# Patient Record
Sex: Male | Born: 1985 | Race: White | Hispanic: No | Marital: Single | Smoking: Never smoker
Health system: Southern US, Community
[De-identification: ages and names within clinical notes are randomized; demographics above are authoritative.]

## PROBLEM LIST (undated history)

## (undated) DIAGNOSIS — L989 Disorder of the skin and subcutaneous tissue, unspecified: Secondary | ICD-10-CM

## (undated) DIAGNOSIS — Q249 Congenital malformation of heart, unspecified: Secondary | ICD-10-CM

## (undated) DIAGNOSIS — E119 Type 2 diabetes mellitus without complications: Secondary | ICD-10-CM

## (undated) HISTORY — PX: KNEE SURGERY: SHX244

---
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
## 2007-11-25 ENCOUNTER — Emergency Department (HOSPITAL_COMMUNITY): Admission: EM | Admit: 2007-11-25 | Discharge: 2007-11-26 | Payer: Self-pay | Admitting: Emergency Medicine

## 2008-09-29 ENCOUNTER — Ambulatory Visit (HOSPITAL_COMMUNITY): Admission: RE | Admit: 2008-09-29 | Discharge: 2008-09-29 | Payer: Self-pay | Admitting: Family Medicine

## 2010-11-23 LAB — BLOOD GAS, VENOUS
Acid-Base Excess: 1 mmol/L (ref 0.0–2.0)
Bicarbonate: 25.7 mEq/L — ABNORMAL HIGH (ref 20.0–24.0)
FIO2: 0.21 %
O2 Saturation: 33.4 %
Patient temperature: 98.6
TCO2: 27.1 mmol/L (ref 0–100)
pCO2, Ven: 45.6 mmHg (ref 45.0–50.0)
pH, Ven: 7.369 — ABNORMAL HIGH (ref 7.250–7.300)
pO2, Ven: 20.5 mmHg — CL (ref 30.0–45.0)

## 2010-12-24 NOTE — Discharge Summary (Signed)
NAME:  [name], [name]                          ACCOUNT NO.:  [id number]   MEDICAL RECORD NO.:  [id number]                   PATIENT TYPE:  INP   LOCATION:  1610                                 FACILITY:  [facility]   PHYSICIAN:  Luna Glasgow, M.D.            DATE OF BIRTH:  [birth year]   DATE OF ADMISSION:  04/01/2002  DATE OF DISCHARGE:  04/05/2002                                 DISCHARGE SUMMARY   ADMITTING DIAGNOSIS:  Diabetic ketoacidosis.   DISCHARGE DIAGNOSES:  1. Diabetic ketoacidosis, resolved.  2. New-onset type 1 diabetes.   ADMISSION HISTORY:  The patient is a 25 year old white male with Carylon Perches-  Danlos, type I, and ADHD, who presented to his primary care physician at  [facility] on April 01, 2002 with weakness and  vomiting overnight, in addition to a two-week history of polydipsia,  polyuria and a 27-pound weight loss (the previous month).  At the  physician's office, he was found to be hypertensive and hyperglycemic with a  blood sugar of 500.  After IV fluids were started in the physician's office,  he was transported by EMS to [facility] where he was placed on  insulin drip at 0.1 u/kg per hour and two times his maintenance IV fluid.  On admission, both the patient and his parents denied that he had any  abdominal pain, hyperphagia, fatigue, lethargy, mental status changes, fever  or ingestions and there is no family history of any diabetes.   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   MEDICATIONS:  The patient is on no medications although he had been  prescribed Ritalin.   ALLERGIES:  He had an allergy to SULFA.   PHYSICAL EXAMINATION:  On presentation in the emergency room, he was  afebrile with heart rate in the 100s, saturating  99% on room air.  He was  alert, tired appearing, but had no fetid odor to his breath and had no  Kussmaul's breathing.   LABORATORY AND ACCESSORY CLINICAL DATA:  His blood sugar on presentation in  the emergency room was documented at 946.  His arterial blood gas showed a  pH of 7.248, PCO2 of 32.8, bicarb of 14 and a base excess of 12.  Chemistries showed a sodium of 141, potassium of 6.6, a BUN of 30 and a  creatinine of 2.   HOSPITAL COURSE:  The patient's hydration with two times maintenance load  was continued and he was maintained on his insulin drip until his pH was  corrected.  His blood sugars were monitored with a goal of decreasing them  by approximately 100 mg/dl per hour.  Diabetes education was begun for both  the patient and his parents.  The patient's insulin  drip was stopped once  his blood sugars were brought down to 200 to 300 range and he was begun on a  regimen of subcutaneous insulin as well as sliding-scale coverage.  During  his hospital stay, the patient was seen by both pediatric psychologist and  nutritionist, as well as diabetes educator.  During his stay, the goal of  the patient's blood sugars were 150 to 250 mg/dl.  His regimen was increased  from 0.75 u/kg per day to 1 u/kg per day to achieve better control of his  blood sugars.  Ketonuria had resolved by hospital day #2 and IV fluids were  discontinued.  During his hospital stay, ___________ blood through use of  the lancets.  The patient and his parents were informed of exposure with the  need for consistent sugar testing, to which they consented.  Forms were  filled out for the patient's school, informing them of his condition and  medications that he would need to take during school hours as well as  treatment for hypo or hyperglycemia.  Throughout the hospital stay, the  patient and his family received repetitive education regarding carbohydrate  counting, diabetic diet and diabetes management.  His  CBGs remained elevated  above target level, especially in the evening, so his evening NPH and  Regular dosages were increased so that his final regimen upon discharge was  20 units of NPH and 10 units of Regular q.a.m. and 9 units of NPH and 9  units of Regular q.p.m.  It was anticipated that his daytime CBGs would be  decreased somewhat due to increased activity during the day once he was  outside the hospital.  Followup was scheduled with Dr. Danise Mina, a  [facility] endocrinologist, as well as a geneticist at [facility] for followup on  Ehlers-Danlos.  On hospital day #5, after adequate education and control of  his blood sugars were obtained, the patient was discharged to home with his  parents.   DISCHARGE CONDITION:  Improved and stable.   DISPOSITION:  Discharged to home with parents.   DISCHARGE MEDICATIONS:  Metformin regimen of 20 units NPH and 10 units of  Regular insulin q.a.m. as well as 9 units of NPH and 9 units of Regular  insulin q.p.m.   DISCHARGE INSTRUCTIONS:  The patient was instructed to check his blood sugar  prior to eating breakfast in the morning and to inject his morning regimen  if blood sugar was greater than 60, to be followed by a normal breakfast.  At lunchtime, the patient was instructed to take his blood sugar prior to  eating or drinking.  At dinnertime, the patient was also instructed to check  his blood sugar prior to eating or drinking and if his blood sugar was  greater than 60, to inject 9 units of NPH and 9 units Regular insulin prior  to eating a normal dinner.  Finally, the patient was instructed to his blood  sugar prior to bedtime.  He was instructed to log all these blood sugar  readings into a log book which he was instructed to bring to all future  doctor's appointments.  The patient was also instructed to check his blood  sugar anytime he has symptoms of hypoglycemia, which have been instructed on.  If his blood sugar was less than 60,  he was instructed to drink orange  juice, eat some candy or eat a peanut butter and jelly sandwich and then  call Dr. Danise Mina  at [phone].  If he was unable to reach her  immediately, he was then instructed to call the pediatrics floor at [phone]  and ask for a pediatric resident.  If his blood sugar was found to be over  350, he was also instructed to call Dr. Danise Mina, or the pediatric  resident on the floor if he was unable to reach her.  For blood sugar over  300, he was also instructed to check for ketones as he had been instructed  to do in the hospital.  He was to call Dr. Langston Masker or the pediatric resident  at [facility] if his ketones were measured at moderate or greater.  The  patient was also instructed to call Dr. Danise Mina or the pediatric  resident immediately if he had any nausea or vomiting.   DIET:  The patient was to follow a diabetic diet of 220 kilocalorie as  taught in the hospital and to try to count his carbohydrate intake.   FOLLOWUP:  The patient was to call Dr. Danise Mina at [phone] on  August 30th to let her know how he was doing status post discharge.  He was  also to follow up with Dr. Danise Mina on Tuesday, September 2nd, at 11  a.m. at her office in [place].  On Wednesday, September 10th, at 8 a.m.,  the patient and his parents were instructed to [facility].  Finally, an appointment was made with  Shearon Balo, a geneticist at [facility], for his Lorinda Creed syndrome on October 10, 2002 at 8:30 a.m.  The patient's parents were  instructed to have his primary physician, Dr. Windle Guard, send the  medical information , a fax, to [phone], attention Albin Felling, prior to his  visit with the geneticist.  The patient's parents were also given a contact  number for [facility] at [phone] for a local Ehlers-Danlos support  group.     Georgina Peer, M.D.                 Luna Glasgow, M.D.    JM/MEDQ  D:  04/28/2002  T:  05/01/2002  Job:  [phone]

## 2011-05-03 LAB — URINALYSIS, ROUTINE W REFLEX MICROSCOPIC
Glucose, UA: >1000 — AB
Hgb urine dipstick: NEGATIVE
Leukocytes, UA: NEGATIVE
Nitrite: NEGATIVE
Protein, ur: NEGATIVE
Specific Gravity, Urine: 1.044 — ABNORMAL HIGH
Urobilinogen, UA: 1
pH: 6

## 2011-05-03 LAB — HEPATIC FUNCTION PANEL
ALT: 18
AST: 26
Albumin: 4.3
Alkaline Phosphatase: 71
Bilirubin, Direct: 0.2
Indirect Bilirubin: 1.1 — ABNORMAL HIGH
Total Bilirubin: 1.3 — ABNORMAL HIGH
Total Protein: 7.2

## 2011-05-03 LAB — BASIC METABOLIC PANEL
BUN: 20
CO2: 25
Calcium: 9.8
Chloride: 105
Creatinine, Ser: 1.02
GFR calc Af Amer: >60
GFR calc non Af Amer: >60
Glucose, Bld: 185 — ABNORMAL HIGH
Potassium: 4
Sodium: 140

## 2011-05-03 LAB — CBC
HCT: 46.4
Hemoglobin: 16.3
MCHC: 35.2
MCV: 94
Platelets: 179
RBC: 4.94
RDW: 11.9
WBC: 10.3

## 2011-05-03 LAB — DIFFERENTIAL
Basophils Absolute: 0
Basophils Relative: 0
Eosinophils Absolute: 0
Eosinophils Relative: 0
Lymphocytes Relative: 3 — ABNORMAL LOW
Lymphs Abs: 0.3 — ABNORMAL LOW
Monocytes Absolute: 0.3
Monocytes Relative: 3
Neutro Abs: 9.7 — ABNORMAL HIGH
Neutrophils Relative %: 94 — ABNORMAL HIGH

## 2011-05-03 LAB — LIPASE, BLOOD: Lipase: 17

## 2011-05-03 LAB — URINE MICROSCOPIC-ADD ON: Urine-Other: NONE SEEN

## 2013-02-18 ENCOUNTER — Emergency Department (HOSPITAL_COMMUNITY)
Admission: EM | Admit: 2013-02-18 | Discharge: 2013-02-18 | Disposition: A | Discharge status: Home / Self Care | Payer: Self-pay | Attending: Emergency Medicine | Admitting: Emergency Medicine

## 2013-02-18 ENCOUNTER — Encounter (HOSPITAL_COMMUNITY): Payer: Self-pay

## 2013-02-18 DIAGNOSIS — E119 Type 2 diabetes mellitus without complications: Secondary | ICD-10-CM | POA: Insufficient documentation

## 2013-02-18 DIAGNOSIS — D2330 Other benign neoplasm of skin of unspecified part of face: Secondary | ICD-10-CM

## 2013-02-18 DIAGNOSIS — Z872 Personal history of diseases of the skin and subcutaneous tissue: Secondary | ICD-10-CM | POA: Insufficient documentation

## 2013-02-18 DIAGNOSIS — Z8679 Personal history of other diseases of the circulatory system: Secondary | ICD-10-CM | POA: Insufficient documentation

## 2013-02-18 DIAGNOSIS — D367 Benign neoplasm of other specified sites: Secondary | ICD-10-CM | POA: Insufficient documentation

## 2013-02-18 DIAGNOSIS — Z794 Long term (current) use of insulin: Secondary | ICD-10-CM | POA: Insufficient documentation

## 2013-02-18 HISTORY — DX: Congenital malformation of heart, unspecified: Q24.9

## 2013-02-18 HISTORY — DX: Type 2 diabetes mellitus without complications: E11.9

## 2013-02-18 HISTORY — DX: Disorder of the skin and subcutaneous tissue, unspecified: L98.9

## 2013-02-18 MED ORDER — CLINDAMYCIN HCL 150 MG PO CAPS: 450.0000 mg | ORAL_CAPSULE | Freq: Three times a day (TID) | ORAL | Status: DC

## 2013-02-18 NOTE — ED Notes (Signed)
Patient states that he chipped his right lower tooth 2 months ago and has had dental pain x 1 week. Patient has swelling to the right lower jaw area and pain.

## 2013-02-18 NOTE — ED Notes (Signed)
rx x 1 given for clindamycin

## 2013-02-18 NOTE — ED Provider Notes (Signed)
History  This chart was scribed for Louis Mutton, PA-C working with [name], MD by [name], ED scribe. This patient was seen in room WTR8/WTR8 and the patient's care was started at 7:00 PM.  CSN: [id number] Arrival date & time 02/18/13  1729   Chief Complaint  Patient presents with  . dental abscess    The history is provided by the patient. No language interpreter was used.    HPI Comments: [name] is a 27 y.o. male who presents to the Emergency Department complaining of gradually worsening, constant right lower facial swelling that started 1 week ago. Pt states he chipped his right lower tooth 2 months ago but it has no associated pain. He states when he opens his mouth, he gets a throbbing pain in his jaw. Pt states the pain is tolerable when he chews. Pt states he took Ketoralac with no relief. Denied radiation. Pt denies neck pain, neck stiffness, fevers, chills, diaphoresis, CP, SOB, difficulty breathing, HA, numbness, tingling, visual disturbance, hiking, bug/tic bites as associated symptoms. Pt states he rides a motorcycle so he sweats in his helmet often.   PCP is [name] in [place]  Past Medical History  Diagnosis Date  . Diabetes mellitus without complication   . Skin disorder   . Congenital heart problem    Past Surgical History  Procedure Laterality Date  . Knee surgery     History reviewed. No pertinent family history. History  Substance Use Topics  . Smoking status: Never Smoker   . Smokeless tobacco: Never Used  . Alcohol Use: No    Review of Systems  Constitutional: Negative for fever and chills.  HENT: Positive for facial swelling and dental problem. Negative for neck pain and neck stiffness.   Eyes: Negative for visual disturbance.  Respiratory: Negative for shortness of breath.   Cardiovascular: Negative for chest pain.  Neurological: Negative for numbness and headaches.  All other systems reviewed and are  negative.    Allergies  Sulfa antibiotics  Home Medications   Current Outpatient Rx  Name  Route  Sig  Dispense  Refill  . insulin aspart (NOVOLOG) 100 UNIT/ML injection   Subcutaneous   Inject 14 Units into the skin 3 (three) times daily with meals.         . insulin detemir (LEVEMIR) 100 UNIT/ML injection   Subcutaneous   Inject 32 Units into the skin 2 (two) times daily.         Marland Kitchen ketorolac (TORADOL) 10 MG tablet   Oral   Take 10 mg by mouth every 6 (six) hours as needed for pain.         . clindamycin (CLEOCIN) 150 MG capsule   Oral   Take 3 capsules (450 mg total) by mouth 3 (three) times daily.   63 capsule   0     BP 119/74  Pulse 92  Temp(Src) 98.2 F (36.8 C) (Oral)  Resp 18  Ht 5\' 9"  (1.753 m)  Wt 160 lb (72.576 kg)  BMI 23.62 kg/m2  SpO2 98%  Physical Exam  Nursing note and vitals reviewed. Constitutional: He is oriented to person, place, and time. He appears well-developed and well-nourished. No distress.  HENT:  Head: Normocephalic and atraumatic.  Mouth/Throat: No oropharyngeal exudate, posterior oropharyngeal erythema or tonsillar abscesses.    Cracked lower right 2nd molar. Negative submandibular lesions.  Negative abscess, cyst, pain upon palpation to the gums - upper and lower and bilaterally. Negative active  bleeding or drainage. Negative trismus. Negative sublingual lesion.   Eyes: Conjunctivae and EOM are normal. Pupils are equal, round, and reactive to light. Right eye exhibits no discharge. Left eye exhibits no discharge.  Neck: Normal range of motion. Neck supple. No tracheal deviation present.    Full ROM  Cardiovascular: Normal rate, regular rhythm and normal heart sounds.   No murmur heard. Pulses:      Radial pulses are 2+ on the right side, and 2+ on the left side.  Pulmonary/Chest: Effort normal and breath sounds normal. No respiratory distress. He has no wheezes. He has no rales.  Musculoskeletal: Normal range of  motion.  Lymphadenopathy:    He has no cervical adenopathy.  Neurological: He is alert and oriented to person, place, and time. No cranial nerve deficit. GCS eye subscore is 4. GCS verbal subscore is 5. GCS motor subscore is 6.  Cranial nerves III-XII grossly intact.   Skin: Skin is warm and dry. No rash noted. No erythema.  Psychiatric: He has a normal mood and affect. His behavior is normal.    ED Course  [place] bedside Date/Time: 02/18/2013 8:39 PM Performed by: [name] Authorized by: [name] Consent: Verbal consent obtained. Risks and benefits: risks, benefits and alternatives were discussed Consent given by: patient Patient understanding: patient states understanding of the procedure being performed Patient consent: the patient's understanding of the procedure matches consent given Patient identity confirmed: verbally with patient and arm band Local anesthesia used: no Patient sedated: no Patient tolerance: Patient tolerated the procedure well with no immediate complications. Comments: Heterogenous of tissue and fluid - negative for drainage. Firm cyst-like lesion on right mandibular region    (including critical care time)    DIAGNOSTIC STUDIES: Oxygen Saturation is 98% on RA, normal by my interpretation.    COORDINATION OF CARE: 8:03 PM-Discussed treatment plan which includes talking with Dr. [name] with pt at bedside and pt agreed to plan.   Labs Reviewed - No data to display No results found. 1. Dermoid cyst of face   2. DM (diabetes mellitus)     MDM  I personally performed the services described in this documentation, which was scribed in my presence. The recorded information has been reviewed and is accurate.  [name] is a 27 y/o M with PMHx of DM presenting to the ED with swelling to the right mandibular region that has been ongoing for the past week, gotten larger over the past couple of days. Denied pain with rest, throbbing sensation  with opening mouth. Denied fever, chills, neck stiffness, negative pain.  Approximately 1.5 cm x 1.5 cm circular, firm cyst-like lesion located to the right mandibular region. Negative pain upon palpation - negative inflammation, erythema, drainage noted. Negative nuchal rigidity, negative neck stiffness, negative lymphadenopathy. Negative focal neurological deficits noted. Negative sublingual lesion, negative trismus. Cracked tooth to the right lower second molar - negative pain upon palpation with tongue depressor. Negative abscesses or cyst palpated during mouth exam.  Discussed case with Dr. Abran Duke - Dr. Abran Duke assessed patient - [place] bedside performed, heterogenous tissue and fluid noted to the lesion - doubt abscess. Doubt parotiditis. Doubt Ludwig's angina. Doubt submandibular gland blockage. Suspicion to be of cyst formation, possible dental abscess (?). Dr. Abran Duke recommended placing patient on antibiotic therapy and outpatient CT with PCP. Patient stable, afebrile. Discharged patient. Placed patient on antibiotics - high risk of infection due to being diabetic. Recommended patient to use Ibuprofen as when needed.  Referred patient to PCP for follow-up, discussed to get CT as outpatient. Referred to dentist and oral surgeon. Discussed with patient to continue to monitor symptoms and if symptoms are to worsen or change to report back to the ED -strict return instructions given.  Patient agreed to plan of care, understood, all questions answered.   Louis Mutton, PA-C 02/19/13 [id number]

## 2013-02-20 NOTE — ED Provider Notes (Signed)
Medical screening examination/treatment/procedure(s) were conducted as a shared visit with non-physician practitioner(s) or resident  and myself.  I personally evaluated the patient during the encounter and agree with the findings and plan unless otherwise indicated.  Firm swelling at angle of mandible.  No induration or warmth, no signs of active infection except swelling.  Poor dentition.  No submandibular swelling, supple neck.  Well appearing.  Recommended CT in ED, pt prefers outpt CT and fup.  Abx due to possible infectious source and pt DM.  DC  [name], MD 02/20/13 1719

## 2014-02-11 ENCOUNTER — Ambulatory Visit (INDEPENDENT_AMBULATORY_CARE_PROVIDER_SITE_OTHER): Payer: BC Managed Care – PPO | Admitting: Internal Medicine

## 2014-02-11 ENCOUNTER — Encounter: Payer: Self-pay | Admitting: Internal Medicine

## 2014-02-11 VITALS — BP 126/84 | HR 71 | Temp 98.2°F | Ht 69.0 in | Wt 153.0 lb

## 2014-02-11 DIAGNOSIS — E109 Type 1 diabetes mellitus without complications: Secondary | ICD-10-CM | POA: Insufficient documentation

## 2014-02-11 DIAGNOSIS — E1065 Type 1 diabetes mellitus with hyperglycemia: Secondary | ICD-10-CM

## 2014-02-11 DIAGNOSIS — IMO0002 Reserved for concepts with insufficient information to code with codable children: Secondary | ICD-10-CM

## 2014-02-11 LAB — COMPREHENSIVE METABOLIC PANEL
ALT: 18 U/L (ref 0–53)
AST: 23 U/L (ref 0–37)
Albumin: 4 g/dL (ref 3.5–5.2)
Alkaline Phosphatase: 95 U/L (ref 39–117)
BUN: 19 mg/dL (ref 6–23)
CO2: 27 mEq/L (ref 19–32)
Calcium: 8.7 mg/dL (ref 8.4–10.5)
Chloride: 102 mEq/L (ref 96–112)
Creatinine, Ser: 0.9 mg/dL (ref 0.4–1.5)
GFR: 110.67 mL/min (ref >60.00)
Glucose, Bld: 392 mg/dL — ABNORMAL HIGH (ref 70–99)
Potassium: 4.2 mEq/L (ref 3.5–5.1)
Sodium: 136 mEq/L (ref 135–145)
Total Bilirubin: 0.5 mg/dL (ref 0.2–1.2)
Total Protein: 6.7 g/dL (ref 6.0–8.3)

## 2014-02-11 LAB — LIPID PANEL
Cholesterol: 180 mg/dL (ref 0–200)
HDL: 47.6 mg/dL (ref >39.00)
LDL Cholesterol: 72 mg/dL (ref 0–99)
NonHDL: 132.4
Total CHOL/HDL Ratio: 4
Triglycerides: 302 mg/dL — ABNORMAL HIGH (ref 0.0–149.0)
VLDL: 60.4 mg/dL — ABNORMAL HIGH (ref 0.0–40.0)

## 2014-02-11 LAB — T4, FREE: Free T4: 0.97 ng/dL (ref 0.60–1.60)

## 2014-02-11 LAB — MICROALBUMIN / CREATININE URINE RATIO
Creatinine,U: 62.4 mg/dL
Microalb Creat Ratio: 0.3 mg/g (ref 0.0–30.0)
Microalb, Ur: 0.2 mg/dL (ref 0.0–1.9)

## 2014-02-11 LAB — HEMOGLOBIN A1C: Hgb A1c MFr Bld: 10.5 % — ABNORMAL HIGH (ref 4.6–6.5)

## 2014-02-11 LAB — TSH: TSH: 0.99 u[IU]/mL (ref 0.35–4.50)

## 2014-02-11 MED ORDER — INSULIN DETEMIR 100 UNIT/ML ~~LOC~~ SOLN: 32.0000 [IU] | Freq: Every day | SUBCUTANEOUS | Status: DC

## 2014-02-11 MED ORDER — INSULIN ASPART 100 UNIT/ML ~~LOC~~ SOLN: 10.0000 [IU] | Freq: Three times a day (TID) | SUBCUTANEOUS | Status: DC

## 2014-02-11 NOTE — Progress Notes (Signed)
Patient ID: [name], male   DOB: 02-22-1986, 28 y.o.   MRN: [id number]  HPI: [name] is a 28 y.o.-year-old male, referred by his PCP, Dr. Claris Gower, for management of DM1, uncontrolled, without complications.  Patient has been diagnosed with diabetes in  2002 (89 or 28 y/o); he started on insulin at dx.   Last hemoglobin A1c was: 2-3 mo ago: 9%  Pt is not on an insulin pump. He has been on an insulin pump but stopped as he dropped his sugars 1 year ago >> paramedics. He also had pbs with the tubing.   He is on: - Levemir 32 units 2x a day - NovoLog 14 units bid He does not have a SSI anymore.  Pt checks his sugars 1-3 a day and they are: - am: 63-267 - 2h after brunch: 52-268 - before dinner: 109-289 - 2h after dinner: 122-488 Had recent lows, but not as often than before Lowest sugar was 40s; he has hypoglycemia awareness but ? At what value. He had previous hypoglycemia admissions. He does have a glucagon kit at home. He lives with brother and girlfriend. Highest sugar was Hi. No recent DKA admissions (> 1 year ago).  Pt's meals are: - Brunch (11 am-2 pm) - 90-100 g carbs - Dinner (9:30 pm-11 pm) - 90-100 g carbs - Snacks: fruit gummies, 6 cookies, chips  He works 5 pm - 9:30 pm.  - no CKD, last BUN/creatinine:  Lab Results  Component Value Date   BUN 20 11/26/2007   CREATININE 1.02 11/26/2007   - last eye exam was in 12/2013. No DR.  - no numbness and tingling in his feet.  Pt has FH of DM in mother's side of the family.   ROS: Constitutional: no weight gain/loss, no fatigue, no subjective hyperthermia/hypothermia Eyes: + blurry vision, no xerophthalmia ENT: no sore throat, no nodules palpated in throat, no dysphagia/odynophagia, no hoarseness Cardiovascular: no CP/SOB/palpitations/leg swelling Respiratory: no cough/SOB Gastrointestinal: no N/V/D/C Musculoskeletal: no muscle/joint aches Skin: no rashes Neurological: no  tremors/numbness/tingling/dizziness, + HA Psychiatric: no depression/anxiety  Past Medical History  Diagnosis Date  . Diabetes mellitus without complication   . Skin disorder   . Congenital heart problem    Past Surgical History  Procedure Laterality Date  . Knee surgery     History   Social History  . Marital Status: Single    Spouse Name: N/A    Number of Children: 0   Occupational History  . Not on file.   Social History Main Topics  . Smoking status: Never Smoker   . Smokeless tobacco: Never Used  . Alcohol Use: No  . Drug Use: No    Current Outpatient Prescriptions on File Prior to Visit  Medication Sig Dispense Refill  . insulin aspart (NOVOLOG) 100 UNIT/ML injection Inject 14 Units into the skin 3 (three) times daily with meals.      . insulin detemir (LEVEMIR) 100 UNIT/ML injection Inject 32 Units into the skin 2 (two) times daily.      . clindamycin (CLEOCIN) 150 MG capsule Take 3 capsules (450 mg total) by mouth 3 (three) times daily.  63 capsule  0  . ketorolac (TORADOL) 10 MG tablet Take 10 mg by mouth every 6 (six) hours as needed for pain.       No current facility-administered medications on file prior to visit.   Allergies  Allergen Reactions  . Sulfa Antibiotics Rash    unknown   FH: -  see HPI   PE: BP 126/84  Pulse 71  Temp(Src) 98.2 F (36.8 C) (Oral)  Ht 5' 9"  (1.753 m)  Wt 153 lb (69.4 kg)  BMI 22.58 kg/m2  SpO2 98% Wt Readings from Last 3 Encounters:  02/11/14 153 lb (69.4 kg)  02/18/13 160 lb (72.576 kg)   Constitutional: normal weight, in NAD Eyes: PERRLA, EOMI, no exophthalmos ENT: moist mucous membranes, no thyromegaly, no cervical lymphadenopathy Cardiovascular: RRR, No MRG Respiratory: CTA B Gastrointestinal: abdomen soft, NT, ND, BS+ Musculoskeletal: no deformities, strength intact in all 4 Skin: moist, warm, no rashes, many tatoos Neurological: no tremor with outstretched hands, DTR normal in all 4  ASSESSMENT: 1.  DM1, uncontrolled, without complications  PLAN:  1. Patient with long-standing, uncontrolled DM1, on insulin therapy.  - We discussed about changes to his insulin regimen, as follows:  Patient Instructions  Please change the insulin regimen as follows: - Decrease Levemir to 32 units at bedtime - Change NovoLog as follows: 10 units with a small meal 12 units with a regular meal 14 units with a larger meal - Please add the following Sliding scale of NovoLog: 151-200: + 1 unit 201-250: + 2 units 251-300: + 3 units 301-350: + 4 units >350: + 5 units  Please stop at the lab. Please try to join MyChart for easier communication. I will send you the labs through there. Please return in 1 month with your sugar log.  - advised to inject in the abdomen, not the shoulder - Strongly advised him to start checking sugars at different times of the day - check at least 3 times a day, rotating checks - given sugar log and advised how to fill it and to bring it at next appt  - given foot care handout and explained the principles  - given instructions for hypoglycemia management "15-15 rule"  - advised for yearly eye exams - he is UTD - sent glucagon kit Rx to pharmacy - advised to get ketone strips - advised to always have Glu tablets with him - advised for a Med-alert bracelet mentioning "type 1 diabetes mellitus". - given instruction Re: exercising and driving in DM1 (pt instructions) - no signs of other autoimmune disorders - will check a TSh, fT4 - Return to clinic in 1 mo with sugar log   Office Visit on 02/11/2014  Component Date Value Ref Range Status  . Cholesterol 02/11/2014 180  0 - 200 mg/dL Final   ATP III Classification       Desirable:  < 200 mg/dL               Borderline High:  200 - 239 mg/dL          High:  > = 240 mg/dL  . Triglycerides 02/11/2014 302.0* 0.0 - 149.0 mg/dL Final   Normal:  <150 mg/dLBorderline High:  150 - 199 mg/dL  . HDL 02/11/2014 47.60  >39.00 mg/dL Final   . VLDL 02/11/2014 60.4* 0.0 - 40.0 mg/dL Final  . LDL Cholesterol 02/11/2014 72  0 - 99 mg/dL Final  . Total CHOL/HDL Ratio 02/11/2014 4   Final                  Men          Women1/2 Average Risk     3.4          3.3Average Risk          5.0  4.42X Average Risk          9.6          7.13X Average Risk          15.0          11.0                      . NonHDL 02/11/2014 132.40   Final  . Microalb, Ur 02/11/2014 0.2  0.0 - 1.9 mg/dL Final  . Creatinine,U 02/11/2014 62.4   Final  . Microalb Creat Ratio 02/11/2014 0.3  0.0 - 30.0 mg/g Final  . Hemoglobin A1C 02/11/2014 10.5* 4.6 - 6.5 % Final   Glycemic Control Guidelines for People with Diabetes:Non Diabetic:  <6%Goal of Therapy: <7%Additional Action Suggested:  >8%   . Sodium 02/11/2014 136  135 - 145 mEq/L Final  . Potassium 02/11/2014 4.2  3.5 - 5.1 mEq/L Final  . Chloride 02/11/2014 102  96 - 112 mEq/L Final  . CO2 02/11/2014 27  19 - 32 mEq/L Final  . Glucose, Bld 02/11/2014 392* 70 - 99 mg/dL Final  . BUN 02/11/2014 19  6 - 23 mg/dL Final  . Creatinine, Ser 02/11/2014 0.9  0.4 - 1.5 mg/dL Final  . Total Bilirubin 02/11/2014 0.5  0.2 - 1.2 mg/dL Final  . Alkaline Phosphatase 02/11/2014 95  39 - 117 U/L Final  . AST 02/11/2014 23  0 - 37 U/L Final  . ALT 02/11/2014 18  0 - 53 U/L Final  . Total Protein 02/11/2014 6.7  6.0 - 8.3 g/dL Final  . Albumin 02/11/2014 4.0  3.5 - 5.2 g/dL Final  . Calcium 02/11/2014 8.7  8.4 - 10.5 mg/dL Final  . GFR 02/11/2014 110.67  >60.00 mL/min Final  . TSH 02/11/2014 0.99  0.35 - 4.50 uIU/mL Final  . Free T4 02/11/2014 0.97  0.60 - 1.60 ng/dL Final   TG high, but lipid panel was not drawn fasting. LDL at target. ACR normal. CMP normal, except high glucose. TFTs normal.

## 2014-02-11 NOTE — Patient Instructions (Addendum)
Please change the insulin regimen as follows: - Decrease Levemir to 32 units at bedtime - Change NovoLog as follows: 10 units with a small meal 12 units with a regular meal 14 units with a larger meal - Please add the following Sliding scale of NovoLog: 151-200: + 1 unit 201-250: + 2 units 251-300: + 3 units 301-350: + 4 units >350: + 5 units  Please stop at the lab. Please try to join MyChart for easier communication. I will send you the labs through there. Please return in 1 month with your sugar log.   Basic Rules for Patients with Type I Diabetes Mellitus  1. The American Diabetes Association (ADA) recommended targets: - fasting sugar <130 - after meal sugar <180 - HbA1C <7%  2. Engage in ?150 min moderate exercise per week  3. Make sure you have ?8h of sleep every night as this helps both blood sugars and your weight.  4. Always keep a sugar log (not only record in your meter) and bring it to all appointments with [place].  5. "15-15 rule" for hypoglycemia: if sugars are low, take 15 g of carbs** ("fast sugar" - e.g. 4 glucose tablets, 4 oz orange juice), wait 15 min, then check sugars again. If still <80, repeat. Continue  until your sugars >80, then eat a normal meal.   6. Teach family members and coworkers to inject glucagon. Have a glucagon set at home and one at work. They should call 911 after using the set.  7. Check sugar before driving. If <100, correct, and only start driving if sugars rise ?100. Check sugar every hour when on a long drive.  8. Check sugar before exercising. If <100, correct, and only start exercising if sugars rise ?100. Check sugar every hour when on a long exercise routine and 1h after you finished exercising.   If >250, check urine for ketones. If you have moderate-large ketones in urine, do not start exercise. Hydrate yourself with clear liquids and correct the high sugar. Recheck sugars and ketones before attempting to exercise.  Be aware that  you might need less insulin when exercising.  *intense, short, exercise bursts can increase your sugars, but  *less intense, longer (>1h), exercise routines can decrease your sugars.   9. Make sure you have a MedAlert bracelet or pendant mentioning "Type I Diabetes Mellitus". If you have a prior episode of severe hypoglycemia or hypoglycemia unawareness, it should also mention this.  10. Please do not walk barefoot. Inspect your feet for sores/cuts and let us know if you have them.   **E.g. of "fast carbs":   first choice (15 g):  1 tube glucose gel, GlucoPouch 15, 2 oz glucose liquid   second choice (15-16 g):  3 or 4 glucose tablets (best taken  with water), 15 Dextrose Bits chewable   third choice (15-20 g):   cup fruit juice,  cup regular soda, 1 cup skim milk,  1 cup sports drink   fourth choice (15-20 g):  1 small tube [place] gel (not frosting), 2 tbsp raisins, 1 tbsp table sugar,  candy, jelly beans, gum drops - check package for carb amount   (adapted from: Lenice Pressman. "Insulin therapy and hypoglycemia" Endocrinol Metab Clin N Am 2012, 41: 57-87)

## 2014-02-12 ENCOUNTER — Encounter: Payer: Self-pay | Admitting: Internal Medicine

## 2014-03-14 ENCOUNTER — Ambulatory Visit (INDEPENDENT_AMBULATORY_CARE_PROVIDER_SITE_OTHER): Payer: BC Managed Care – PPO | Admitting: Internal Medicine

## 2014-03-14 ENCOUNTER — Encounter: Payer: Self-pay | Admitting: Internal Medicine

## 2014-03-14 ENCOUNTER — Other Ambulatory Visit (INDEPENDENT_AMBULATORY_CARE_PROVIDER_SITE_OTHER): Payer: BC Managed Care – PPO | Admitting: *Deleted

## 2014-03-14 VITALS — BP 112/84 | HR 67 | Temp 98.1°F | Resp 12 | Wt 163.0 lb

## 2014-03-14 DIAGNOSIS — E1065 Type 1 diabetes mellitus with hyperglycemia: Secondary | ICD-10-CM

## 2014-03-14 DIAGNOSIS — IMO0002 Reserved for concepts with insufficient information to code with codable children: Secondary | ICD-10-CM

## 2014-03-14 LAB — GLUCOSE, POCT (MANUAL RESULT ENTRY): POC Glucose: 165 mg/dl — AB (ref 70–99)

## 2014-03-14 NOTE — Patient Instructions (Signed)
-   Continue Levemir to 32 units at bedtime - Take NovoLog as follows: 10 units with brunch 14 units with dinner - Sliding scale of NovoLog: 151-200: + 1 unit 201-250: + 2 units 251-300: + 3 units 301-350: + 4 units >350: + 5 units If you need to take bedtime NovoLog, only take it when sugars >300s, and then take 5-6 units only. Check at least once during the night.  Please check sugars before brunch, before dinner, and at bedtime for now.  Please return in 2 weeks with your sugar log.

## 2014-03-14 NOTE — Progress Notes (Signed)
Patient ID: [name], male   DOB: 04/27/1986, 28 y.o.   MRN: [id number]  HPI: [name] is a 27 y.o.-year-old male, initially referred by his PCP, Dr. Claris Gower, for management of DM1, dx 2002 (15 or 28 y/o), uncontrolled, without complications. He returns for f/u for his DM1. Last visit 1 mo ago.  Last hemoglobin A1c was: Lab Results  Component Value Date   HGBA1C 10.5* 02/11/2014  Prev.: HbA1c 9% in 11/2013  He was on: - Levemir 32 units 2x a day - NovoLog 14 units bid He does not have a SSI anymore.  He is now on: - Levemir to 32 units at bedtime - NovoLog as follows: 10 units with a small meal 12 units with a regular meal 14 units with a larger meal - Sliding scale of NovoLog: 151-200: + 1 unit 201-250: + 2 units 251-300: + 3 units 301-350: + 4 units >350: + 5 units May inject a full 14 units if sugars high at bedtime!  Pt checks his sugars 2-3 a day and they are: - am: 63-267 >> 44, 49, 182, 427 - before brunch: 49, 68 -197 - 2h after brunch: 52-268 >> 51-81, 187, 256 - before dinner: 109-289 >> 97, 112, 180 - 2h after dinner: 122-488 >> 162, 214-HI - does not take the insulin before dinner, but cannot explain how he takes it... Presumably he takes the dinnertime insulin after the meal >> drops sugars during the night Had recent lows, in the 40s (see above) - he cannot explain them; he has hypoglycemia awareness but ? At what value. He had previous hypoglycemia admissions. He does have a glucagon kit at home. He lives with brother and girlfriend. Highest sugar was Hi. No recent DKA admissions (> 1 year ago).  Pt's meals are: - Brunch (11 am-2 pm) - 90-100 g carbs - Dinner (9:30 pm-11 pm) - 90-100 g carbs - Snacks: fruit gummies, 6 cookies, chips  He works 5 pm - 9:30 pm.  - no CKD, last BUN/creatinine:  Lab Results  Component Value Date   BUN 19 02/11/2014   CREATININE 0.9 02/11/2014  He is not on an ACEI. No h/o HTN. No proteinuria. Last  ACR: Component     Latest Ref Rng 02/11/2014  Microalb, Ur     0.0 - 1.9 mg/dL 0.2  Creatinine,U      62.4  MICROALB/CREAT RATIO     0.0 - 30.0 mg/g 0.3  - no HL. Last Lipid panel: Lab Results  Component Value Date   CHOL 180 02/11/2014   HDL 47.60 02/11/2014   LDLCALC 72 02/11/2014   TRIG 302.0* 02/11/2014   CHOLHDL 4 02/11/2014  Not on a statin. - last eye exam was in 12/2013. No DR.  - no numbness and tingling in his feet.  I reviewed pt's medications, allergies, PMH, social hx, family hx and no changes required, except as mentioned above.  ROS: Constitutional: no weight gain/loss, no fatigue, no subjective hyperthermia/hypothermia Eyes: no blurry vision, no xerophthalmia ENT: no sore throat, no nodules palpated in throat, no dysphagia/odynophagia, no hoarseness Cardiovascular: no CP/SOB/palpitations/leg swelling Respiratory: no cough/SOB Gastrointestinal: no N/V/D/C Musculoskeletal: no muscle/joint aches Skin: no rashes Neurological: no tremors/numbness/tingling/dizziness  PE: BP 112/84  Pulse 67  Temp(Src) 98.1 F (36.7 C) (Oral)  Resp 12  Wt 163 lb (73.936 kg)  SpO2 97% Wt Readings from Last 3 Encounters:  03/14/14 163 lb (73.936 kg)  02/11/14 153 lb (69.4 kg)  02/18/13 160  lb (72.576 kg)   Constitutional: normal weight, in NAD Eyes: PERRLA, EOMI, no exophthalmos ENT: moist mucous membranes, no thyromegaly, no cervical lymphadenopathy Cardiovascular: RRR, No MRG Respiratory: CTA B Gastrointestinal: abdomen soft, NT, ND, BS+ Musculoskeletal: no deformities, strength intact in all 4 Skin: moist, warm, no rashes, many tatoos Neurological: no tremor with outstretched hands, DTR normal in all 4  ASSESSMENT: 1. DM1, uncontrolled, without complications  Pt is not on an insulin pump. He has been on an insulin pump but stopped as he dropped his sugars 1 year ago >> paramedics. He also had pbs with the tubing.   PLAN:  1. Patient with long-standing, uncontrolled DM1, on  insulin therapy. His sugars are very fluctuating and he cannot explain exactly how he injects his insulin... We checked CBG in the office as he appears confused >> 165. - We discussed about changes to his insulin regimen, as follows:  Patient Instructions  - Continue Levemir to 32 units at bedtime - Take NovoLog as follows: 10 units with brunch 14 units with dinner - Sliding scale of NovoLog: 151-200: + 1 unit 201-250: + 2 units 251-300: + 3 units 301-350: + 4 units >350: + 5 units If you need to take bedtime NovoLog, only take it when sugars >300s, and then take 5-6 units only. Check at least once during the night.  Please check sugars before brunch, before dinner, and at bedtime for now.  Please return in 2 weeks with your sugar log.   - continue checking sugars at different times of the day - check at least 3 times a day, rotating checks - advised for yearly eye exams - he is UTD - no signs of other autoimmune disorders - TSH, fT4 normal at last visit - Return to clinic in 2 weeks with sugar log

## 2014-04-03 ENCOUNTER — Other Ambulatory Visit: Payer: Self-pay | Admitting: Internal Medicine

## 2014-04-03 DIAGNOSIS — IMO0002 Reserved for concepts with insufficient information to code with codable children: Secondary | ICD-10-CM

## 2014-04-03 DIAGNOSIS — E1065 Type 1 diabetes mellitus with hyperglycemia: Secondary | ICD-10-CM

## 2014-04-04 ENCOUNTER — Ambulatory Visit: Payer: BC Managed Care – PPO | Admitting: Internal Medicine

## 2014-05-12 ENCOUNTER — Ambulatory Visit: Payer: BC Managed Care – PPO | Admitting: Internal Medicine

## 2014-05-13 ENCOUNTER — Ambulatory Visit: Payer: BC Managed Care – PPO | Admitting: Internal Medicine

## 2014-06-12 ENCOUNTER — Encounter: Payer: Self-pay | Admitting: Internal Medicine

## 2014-06-12 ENCOUNTER — Ambulatory Visit (INDEPENDENT_AMBULATORY_CARE_PROVIDER_SITE_OTHER): Payer: BC Managed Care – PPO | Admitting: Internal Medicine

## 2014-06-12 DIAGNOSIS — E1065 Type 1 diabetes mellitus with hyperglycemia: Secondary | ICD-10-CM

## 2014-06-12 DIAGNOSIS — IMO0002 Reserved for concepts with insufficient information to code with codable children: Secondary | ICD-10-CM

## 2014-06-12 MED ORDER — INSULIN DETEMIR 100 UNIT/ML ~~LOC~~ SOLN: 28.0000 [IU] | Freq: Every day | SUBCUTANEOUS | Status: DC

## 2014-06-12 MED ORDER — INSULIN ASPART 100 UNIT/ML ~~LOC~~ SOLN: 8.0000 [IU] | Freq: Three times a day (TID) | SUBCUTANEOUS | Status: DC

## 2014-06-12 NOTE — Patient Instructions (Signed)
Please decrease Levemir to 28 units at bedtime Change NovoLog as follows: 8 units with a smaller meal  10 units with a regular meal 12-14 units with a larger meal If you need to take bedtime NovoLog, only take it when sugars >300s, and then take 5-6 units only.   Please write sugars down daily and add comments on the side of the log.  Please stop at the lab.

## 2014-06-12 NOTE — Progress Notes (Signed)
Patient ID: [name], male   DOB: 09-07-1985, 28 y.o.   MRN: [id number]  HPI: [name] is a 28 y.o.-year-old male, initially referred by his PCP, Dr. Claris Gower, for management of DM1, dx 2002 (15 or 28 y/o), uncontrolled, without complications. He returns for f/u for his DM1. Last visit 3 mo ago (did not return in 2 weeks). He is here with his mother who offers part of te hx.  Last hemoglobin A1c was: Lab Results  Component Value Date   HGBA1C 10.5* 02/11/2014  Prev.: HbA1c 9% in 11/2013  He is on: - Levemir to 32 units at bedtime - NovoLog as follows: 10 units with brunch 14 units with dinner - Sliding scale of NovoLog: 151-200: + 1 unit 201-250: + 2 units 251-300: + 3 units 301-350: + 4 units >350: + 5 units If you need to take bedtime NovoLog, only take it when sugars >300s, and then take 5-6 units only. Check at least once during the night.  Pt checks his sugars 2-3 a day and they are similar to before per review of his log - am: 63-267 >> 44, 49, 182, 427 - before brunch: 49, 68 -197 - 2h after brunch: 52-268 >> 51-81, 187, 256 - before dinner: 109-289 >> 97, 112, 180 - 2h after dinner: 122-488 >> 162, 214-HI - does not take the insulin before dinner, but cannot explain how he takes it... Presumably he takes the dinnertime insulin after the meal >> drops sugars during the night Had recent lows, in the 40s (see above); he has hypoglycemia awareness but ? At what value. He had previous hypoglycemia admissions. He does have a glucagon kit at home. He lives with brother and girlfriend. Highest sugar was Hi. No recent DKA admissions (> 1 year ago).  Pt's meals are: - Brunch (11 am-2 pm) - 90-100 g carbs - Dinner (9:30 pm-11 pm) - 90-100 g carbs - Snacks: fruit gummies, 6 cookies, chips  He works 5 pm - 9:30 pm.  - no CKD, last BUN/creatinine:  Lab Results  Component Value Date   BUN 19 02/11/2014   CREATININE 0.9 02/11/2014  He is not on an ACEI. No h/o  HTN. No proteinuria. Last ACR: Component     Latest Ref Rng 02/11/2014  Microalb, Ur     0.0 - 1.9 mg/dL 0.2  Creatinine,U      62.4  MICROALB/CREAT RATIO     0.0 - 30.0 mg/g 0.3  - no HL. Last Lipid panel: Lab Results  Component Value Date   CHOL 180 02/11/2014   HDL 47.60 02/11/2014   LDLCALC 72 02/11/2014   TRIG 302.0* 02/11/2014   CHOLHDL 4 02/11/2014  Not on a statin. - last eye exam was in 12/2013. No DR.  - no numbness and tingling in his feet.  I reviewed pt's medications, allergies, PMH, social hx, family hx and no changes required, except as mentioned above.  ROS: Constitutional: no weight gain/loss, no fatigue, no subjective hyperthermia/hypothermia Eyes: no blurry vision, no xerophthalmia ENT: no sore throat, no nodules palpated in throat, no dysphagia/odynophagia, no hoarseness Cardiovascular: no CP/SOB/palpitations/leg swelling Respiratory: no cough/SOB Gastrointestinal: no N/V/D/C Musculoskeletal: no muscle/joint aches Skin: no rashes Neurological: no tremors/numbness/tingling/dizziness  PE: BP 110/68 mmHg  Pulse 86  Temp(Src) 98.3 F (36.8 C) (Oral)  Resp 12  Wt 163 lb 3.2 oz (74.027 kg)  SpO2 97% Wt Readings from Last 3 Encounters:  06/12/14 163 lb 3.2 oz (74.027 kg)  03/14/14  163 lb (73.936 kg)  02/11/14 153 lb (69.4 kg)   Constitutional: normal weight, in NAD Eyes: PERRLA, EOMI, no exophthalmos ENT: moist mucous membranes, no thyromegaly, no cervical lymphadenopathy Cardiovascular: RRR, No MRG Respiratory: CTA B Gastrointestinal: abdomen soft, NT, ND, BS+ Musculoskeletal: no deformities, strength intact in all 4 Skin: moist, warm, no rashes, many tatoos Neurological: no tremor with outstretched hands, DTR normal in all 4  ASSESSMENT: 1. DM1, uncontrolled, without complications  Pt is not on an insulin pump. He has been on an insulin pump but stopped as he dropped his sugars 1 year ago >> paramedics. He also had pbs with the tubing.    PLAN:  1. Patient with long-standing, uncontrolled DM1, on insulin therapy. His sugars are very fluctuating and he cannot explain exactly why he has either highs or lows. He believes that sometimes he forgets to inject insulin, and sometimes injects too much. He still has many lows, mostly in the morning >> will reduce Levemir dose. Also, he is having problems calculating the NovoLog dose based on SSI >> will give him fixed doses.  - We discussed about changes to his insulin regimen, as follows:  Patient Instructions  Please decrease Levemir to 28 units at bedtime Change NovoLog as follows: 8 units with a smaller meal  10 units with a regular meal 12-14 units with a larger meal If you need to take bedtime NovoLog, only take it when sugars >300s, and then take 5-6 units only.   Please write sugars down daily and add comments on the side of the log.  Please stop at the lab.  - continue checking sugars at different times of the day - check at least 3 times a day, rotating checks - advised for yearly eye exams - he is UTD - no signs of other autoimmune disorders - TSH, fT4 normal  - he is not sure whether he got the flu vaccine this season... - refilled insulins - which check hemoglobin A1c today - Return to clinic in 3 months with sugar log   Office Visit on 06/12/2014  Component Date Value Ref Range Status  . Hgb A1c MFr Bld 06/12/2014 9.4* 4.6 - 6.5 % Final   Glycemic Control Guidelines for People with Diabetes:Non Diabetic:  <6%Goal of Therapy: <7%Additional Action Suggested:  >8%   HbA1c improved.

## 2014-06-13 LAB — HEMOGLOBIN A1C: Hgb A1c MFr Bld: 9.4 % — ABNORMAL HIGH (ref 4.6–6.5)

## 2014-06-24 ENCOUNTER — Telehealth: Payer: Self-pay | Admitting: Internal Medicine

## 2014-06-30 ENCOUNTER — Other Ambulatory Visit: Payer: Self-pay | Admitting: *Deleted

## 2014-06-30 ENCOUNTER — Telehealth: Payer: Self-pay | Admitting: *Deleted

## 2014-06-30 MED ORDER — INSULIN ASPART 100 UNIT/ML ~~LOC~~ SOLN: 8.0000 [IU] | Freq: Three times a day (TID) | SUBCUTANEOUS | Status: AC

## 2014-06-30 MED ORDER — INSULIN DETEMIR 100 UNIT/ML ~~LOC~~ SOLN: 28.0000 [IU] | Freq: Every day | SUBCUTANEOUS | Status: AC

## 2014-06-30 NOTE — Telephone Encounter (Signed)
Called and spoke with pt's mother. Pt is in hospital with DKA. I asked if he has not taken his insulin. She stated that he it was not called into the pharmacy. I checked and it was on No Print. She said that pt called up here and was told it would be called in and was not. Pt did not call back nor did he contact the pharmacy again. It has been sent in to the pharmacy now. Be advised.

## 2014-08-06 NOTE — Telephone Encounter (Signed)
error 

## 2014-09-12 ENCOUNTER — Ambulatory Visit: Payer: BC Managed Care – PPO | Admitting: Internal Medicine

## 2014-11-07 ENCOUNTER — Encounter (HOSPITAL_COMMUNITY): Payer: Self-pay | Admitting: *Deleted

## 2014-11-07 ENCOUNTER — Inpatient Hospital Stay (HOSPITAL_COMMUNITY)
Admission: EM | Admit: 2014-11-07 | Discharge: 2014-11-17 | DRG: 464 | Disposition: A | Discharge status: Home / Self Care | Payer: BLUE CROSS/BLUE SHIELD | Attending: Surgery | Admitting: Surgery

## 2014-11-07 DIAGNOSIS — R52 Pain, unspecified: Secondary | ICD-10-CM

## 2014-11-07 DIAGNOSIS — S62309A Unspecified fracture of unspecified metacarpal bone, initial encounter for closed fracture: Secondary | ICD-10-CM | POA: Diagnosis present

## 2014-11-07 DIAGNOSIS — S62394A Other fracture of fourth metacarpal bone, right hand, initial encounter for closed fracture: Secondary | ICD-10-CM | POA: Diagnosis present

## 2014-11-07 DIAGNOSIS — Q796 Ehlers-Danlos syndrome, unspecified: Secondary | ICD-10-CM

## 2014-11-07 DIAGNOSIS — S32409A Unspecified fracture of unspecified acetabulum, initial encounter for closed fracture: Secondary | ICD-10-CM | POA: Diagnosis present

## 2014-11-07 DIAGNOSIS — M79661 Pain in right lower leg: Secondary | ICD-10-CM | POA: Diagnosis not present

## 2014-11-07 DIAGNOSIS — Y92410 Unspecified street and highway as the place of occurrence of the external cause: Secondary | ICD-10-CM

## 2014-11-07 DIAGNOSIS — S81821A Laceration with foreign body, right lower leg, initial encounter: Secondary | ICD-10-CM | POA: Diagnosis present

## 2014-11-07 DIAGNOSIS — T148XXA Other injury of unspecified body region, initial encounter: Secondary | ICD-10-CM

## 2014-11-07 DIAGNOSIS — S32431A Displaced fracture of anterior column [iliopubic] of right acetabulum, initial encounter for closed fracture: Principal | ICD-10-CM | POA: Diagnosis present

## 2014-11-07 DIAGNOSIS — S32401A Unspecified fracture of right acetabulum, initial encounter for closed fracture: Secondary | ICD-10-CM

## 2014-11-07 DIAGNOSIS — IMO0002 Reserved for concepts with insufficient information to code with codable children: Secondary | ICD-10-CM | POA: Diagnosis present

## 2014-11-07 DIAGNOSIS — E109 Type 1 diabetes mellitus without complications: Secondary | ICD-10-CM | POA: Diagnosis present

## 2014-11-07 DIAGNOSIS — S62334A Displaced fracture of neck of fourth metacarpal bone, right hand, initial encounter for closed fracture: Secondary | ICD-10-CM | POA: Diagnosis present

## 2014-11-07 DIAGNOSIS — Z794 Long term (current) use of insulin: Secondary | ICD-10-CM

## 2014-11-07 MED ORDER — SODIUM CHLORIDE 0.9 % IV SOLN
1000.0000 mL | INTRAVENOUS | Status: DC
  Administered 2014-11-08 – 2014-11-09 (×2): 1000 mL via INTRAVENOUS

## 2014-11-07 MED ORDER — MORPHINE SULFATE 4 MG/ML IJ SOLN
4.0000 mg | Freq: Once | INTRAMUSCULAR | Status: AC
  Administered 2014-11-08: 4 mg via INTRAVENOUS
  Filled 2014-11-07: qty 1

## 2014-11-07 MED ORDER — TETANUS-DIPHTHERIA TOXOIDS TD 5-2 LFU IM INJ: 0.5000 mL | INJECTION | Freq: Once | INTRAMUSCULAR | Status: DC

## 2014-11-07 MED ORDER — ONDANSETRON HCL 4 MG/2ML IJ SOLN
4.0000 mg | Freq: Once | INTRAMUSCULAR | Status: AC
  Administered 2014-11-08: 4 mg via INTRAVENOUS
  Filled 2014-11-07: qty 2

## 2014-11-07 MED ORDER — SODIUM CHLORIDE 0.9 % IV SOLN
1000.0000 mL | Freq: Once | INTRAVENOUS | Status: AC
  Administered 2014-11-08: 1000 mL via INTRAVENOUS
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
## 2014-11-08 ENCOUNTER — Encounter (HOSPITAL_COMMUNITY): Admission: EM | Disposition: A | Discharge status: Home / Self Care | Payer: Self-pay | Source: Home / Self Care

## 2014-11-08 ENCOUNTER — Inpatient Hospital Stay (HOSPITAL_COMMUNITY): Payer: BLUE CROSS/BLUE SHIELD

## 2014-11-08 ENCOUNTER — Emergency Department (HOSPITAL_COMMUNITY): Payer: BLUE CROSS/BLUE SHIELD

## 2014-11-08 ENCOUNTER — Inpatient Hospital Stay (HOSPITAL_COMMUNITY): Payer: BLUE CROSS/BLUE SHIELD | Admitting: Certified Registered Nurse Anesthetist

## 2014-11-08 ENCOUNTER — Encounter (HOSPITAL_COMMUNITY): Payer: Self-pay | Admitting: Radiology

## 2014-11-08 DIAGNOSIS — Q796 Ehlers-Danlos syndrome, unspecified: Secondary | ICD-10-CM

## 2014-11-08 DIAGNOSIS — S32431A Displaced fracture of anterior column [iliopubic] of right acetabulum, initial encounter for closed fracture: Secondary | ICD-10-CM | POA: Diagnosis present

## 2014-11-08 DIAGNOSIS — Y92410 Unspecified street and highway as the place of occurrence of the external cause: Secondary | ICD-10-CM | POA: Diagnosis not present

## 2014-11-08 DIAGNOSIS — S62394A Other fracture of fourth metacarpal bone, right hand, initial encounter for closed fracture: Secondary | ICD-10-CM | POA: Diagnosis present

## 2014-11-08 DIAGNOSIS — M79661 Pain in right lower leg: Secondary | ICD-10-CM | POA: Diagnosis present

## 2014-11-08 DIAGNOSIS — S62309A Unspecified fracture of unspecified metacarpal bone, initial encounter for closed fracture: Secondary | ICD-10-CM | POA: Diagnosis present

## 2014-11-08 DIAGNOSIS — S62334A Displaced fracture of neck of fourth metacarpal bone, right hand, initial encounter for closed fracture: Secondary | ICD-10-CM | POA: Diagnosis present

## 2014-11-08 DIAGNOSIS — S81821A Laceration with foreign body, right lower leg, initial encounter: Secondary | ICD-10-CM | POA: Diagnosis present

## 2014-11-08 DIAGNOSIS — Z794 Long term (current) use of insulin: Secondary | ICD-10-CM | POA: Diagnosis not present

## 2014-11-08 DIAGNOSIS — S32409A Unspecified fracture of unspecified acetabulum, initial encounter for closed fracture: Secondary | ICD-10-CM | POA: Diagnosis present

## 2014-11-08 DIAGNOSIS — E109 Type 1 diabetes mellitus without complications: Secondary | ICD-10-CM | POA: Diagnosis present

## 2014-11-08 HISTORY — PX: CLOSED REDUCTION METACARPAL WITH PERCUTANEOUS PINNING: SHX5613

## 2014-11-08 HISTORY — PX: I&D EXTREMITY: SHX5045

## 2014-11-08 LAB — CBC
HCT: 40.9 % (ref 39.0–52.0)
Hemoglobin: 13.9 g/dL (ref 13.0–17.0)
MCH: 32 pg (ref 26.0–34.0)
MCHC: 34 g/dL (ref 30.0–36.0)
MCV: 94.2 fL (ref 78.0–100.0)
Platelets: 171 10*3/uL (ref 150–400)
RBC: 4.34 MIL/uL (ref 4.22–5.81)
RDW: 12 % (ref 11.5–15.5)
WBC: 7.6 10*3/uL (ref 4.0–10.5)

## 2014-11-08 LAB — I-STAT CHEM 8, ED
BUN: 22 mg/dL (ref 6–23)
Calcium, Ion: 1.14 mmol/L (ref 1.12–1.23)
Chloride: 101 mmol/L (ref 96–112)
Creatinine, Ser: 0.9 mg/dL (ref 0.50–1.35)
Glucose, Bld: 414 mg/dL — ABNORMAL HIGH (ref 70–99)
HCT: 43 % (ref 39.0–52.0)
Hemoglobin: 14.6 g/dL (ref 13.0–17.0)
Potassium: 4.8 mmol/L (ref 3.5–5.1)
Sodium: 134 mmol/L — ABNORMAL LOW (ref 135–145)
TCO2: 21 mmol/L (ref 0–100)

## 2014-11-08 LAB — COMPREHENSIVE METABOLIC PANEL
ALT: 103 U/L — ABNORMAL HIGH (ref 0–53)
AST: 271 U/L — ABNORMAL HIGH (ref 0–37)
Albumin: 3.8 g/dL (ref 3.5–5.2)
Alkaline Phosphatase: 101 U/L (ref 39–117)
Anion gap: 11 (ref 5–15)
BUN: 18 mg/dL (ref 6–23)
CO2: 22 mmol/L (ref 19–32)
Calcium: 8.6 mg/dL (ref 8.4–10.5)
Chloride: 100 mmol/L (ref 96–112)
Creatinine, Ser: 1.02 mg/dL (ref 0.50–1.35)
GFR calc Af Amer: >90 mL/min (ref >90)
GFR calc non Af Amer: >90 mL/min (ref >90)
Glucose, Bld: 402 mg/dL — ABNORMAL HIGH (ref 70–99)
Potassium: 4.9 mmol/L (ref 3.5–5.1)
Sodium: 133 mmol/L — ABNORMAL LOW (ref 135–145)
Total Bilirubin: 0.8 mg/dL (ref 0.3–1.2)
Total Protein: 6.1 g/dL (ref 6.0–8.3)

## 2014-11-08 LAB — GLUCOSE, CAPILLARY
Glucose-Capillary: 275 mg/dL — ABNORMAL HIGH (ref 70–99)
Glucose-Capillary: 237 mg/dL — ABNORMAL HIGH (ref 70–99)
Glucose-Capillary: 197 mg/dL — ABNORMAL HIGH (ref 70–99)
Glucose-Capillary: 134 mg/dL — ABNORMAL HIGH (ref 70–99)
Glucose-Capillary: 244 mg/dL — ABNORMAL HIGH (ref 70–99)
Glucose-Capillary: 183 mg/dL — ABNORMAL HIGH (ref 70–99)

## 2014-11-08 LAB — SURGICAL PCR SCREEN
MRSA, PCR: NEGATIVE
Staphylococcus aureus: NEGATIVE

## 2014-11-08 LAB — SAMPLE TO BLOOD BANK

## 2014-11-08 LAB — ETHANOL: Alcohol, Ethyl (B): <5 mg/dL (ref 0–9)

## 2014-11-08 LAB — CBG MONITORING, ED: Glucose-Capillary: 400 mg/dL — ABNORMAL HIGH (ref 70–99)

## 2014-11-08 LAB — PROTIME-INR
INR: 0.94 (ref 0.00–1.49)
Prothrombin Time: 12.6 seconds (ref 11.6–15.2)

## 2014-11-08 SURGERY — IRRIGATION AND DEBRIDEMENT EXTREMITY
Anesthesia: General | Laterality: Right

## 2014-11-08 MED ORDER — CEFAZOLIN SODIUM-DEXTROSE 2-3 GM-% IV SOLR
2.0000 g | Freq: Three times a day (TID) | INTRAVENOUS | Status: DC
  Administered 2014-11-08 – 2014-11-11 (×7): 2 g via INTRAVENOUS
  Administered 2014-11-11: 1 g via INTRAVENOUS
  Administered 2014-11-11 – 2014-11-17 (×19): 2 g via INTRAVENOUS
  Filled 2014-11-08 (×28): qty 50

## 2014-11-08 MED ORDER — MORPHINE SULFATE 4 MG/ML IJ SOLN
4.0000 mg | Freq: Once | INTRAMUSCULAR | Status: AC
  Administered 2014-11-08: 4 mg via INTRAVENOUS

## 2014-11-08 MED ORDER — OXYCODONE HCL 5 MG/5ML PO SOLN: 5.0000 mg | Freq: Once | ORAL | Status: DC | PRN

## 2014-11-08 MED ORDER — LIDOCAINE HCL (CARDIAC) 20 MG/ML IV SOLN
INTRAVENOUS | Status: DC | PRN
  Administered 2014-11-08: 100 mg via INTRAVENOUS

## 2014-11-08 MED ORDER — INSULIN DETEMIR 100 UNIT/ML ~~LOC~~ SOLN
28.0000 [IU] | Freq: Every day | SUBCUTANEOUS | Status: DC
  Filled 2014-11-08: qty 0.28

## 2014-11-08 MED ORDER — PANTOPRAZOLE SODIUM 40 MG IV SOLR
40.0000 mg | Freq: Every day | INTRAVENOUS | Status: DC
  Filled 2014-11-08 (×2): qty 40

## 2014-11-08 MED ORDER — PROPOFOL 10 MG/ML IV BOLUS
INTRAVENOUS | Status: AC
  Filled 2014-11-08: qty 20

## 2014-11-08 MED ORDER — FENTANYL CITRATE 0.05 MG/ML IJ SOLN
INTRAMUSCULAR | Status: AC
  Filled 2014-11-08: qty 5

## 2014-11-08 MED ORDER — ONDANSETRON HCL 4 MG/2ML IJ SOLN: 4.0000 mg | Freq: Four times a day (QID) | INTRAMUSCULAR | Status: DC | PRN

## 2014-11-08 MED ORDER — CEFAZOLIN SODIUM-DEXTROSE 2-3 GM-% IV SOLR
INTRAVENOUS | Status: AC
  Administered 2014-11-08: 2 g via INTRAVENOUS
  Filled 2014-11-08: qty 50

## 2014-11-08 MED ORDER — HYDROMORPHONE HCL 1 MG/ML IJ SOLN
0.5000 mg | INTRAMUSCULAR | Status: DC | PRN
  Administered 2014-11-08 – 2014-11-12 (×15): 1 mg via INTRAVENOUS
  Filled 2014-11-08 (×16): qty 1

## 2014-11-08 MED ORDER — CEFAZOLIN SODIUM 1-5 GM-% IV SOLN
1.0000 g | Freq: Three times a day (TID) | INTRAVENOUS | Status: DC
  Administered 2014-11-08: 1 g via INTRAVENOUS
  Filled 2014-11-08 (×3): qty 50

## 2014-11-08 MED ORDER — FENTANYL CITRATE 0.05 MG/ML IJ SOLN
INTRAMUSCULAR | Status: DC | PRN
  Administered 2014-11-08: 50 ug via INTRAVENOUS
  Administered 2014-11-08 (×2): 100 ug via INTRAVENOUS

## 2014-11-08 MED ORDER — TETANUS-DIPHTH-ACELL PERTUSSIS 5-2.5-18.5 LF-MCG/0.5 IM SUSP
0.5000 mL | Freq: Once | INTRAMUSCULAR | Status: AC
  Administered 2014-11-08: 0.5 mL via INTRAMUSCULAR
  Filled 2014-11-08: qty 0.5

## 2014-11-08 MED ORDER — SODIUM CHLORIDE 0.9 % IR SOLN
Status: DC | PRN
  Administered 2014-11-08 (×2): 3000 mL

## 2014-11-08 MED ORDER — HYDROMORPHONE HCL 1 MG/ML IJ SOLN
1.0000 mg | Freq: Once | INTRAMUSCULAR | Status: AC
  Administered 2014-11-08: 1 mg via INTRAVENOUS
  Filled 2014-11-08: qty 1

## 2014-11-08 MED ORDER — CEFAZOLIN SODIUM 1-5 GM-% IV SOLN
1.0000 g | Freq: Four times a day (QID) | INTRAVENOUS | Status: AC
Start: 2014-11-08 — End: 2014-11-09
  Administered 2014-11-08 – 2014-11-09 (×3): 1 g via INTRAVENOUS
  Filled 2014-11-08 (×3): qty 50

## 2014-11-08 MED ORDER — ONDANSETRON HCL 4 MG/2ML IJ SOLN
4.0000 mg | Freq: Four times a day (QID) | INTRAMUSCULAR | Status: DC | PRN
Start: 2014-11-08 — End: 2014-11-08

## 2014-11-08 MED ORDER — ONDANSETRON HCL 4 MG PO TABS: 4.0000 mg | ORAL_TABLET | Freq: Four times a day (QID) | ORAL | Status: DC | PRN

## 2014-11-08 MED ORDER — LACTATED RINGERS IV SOLN
INTRAVENOUS | Status: DC | PRN
  Administered 2014-11-08 (×2): via INTRAVENOUS

## 2014-11-08 MED ORDER — ACETAMINOPHEN 500 MG PO TABS
1000.0000 mg | ORAL_TABLET | Freq: Once | ORAL | Status: AC
  Administered 2014-11-08: 1000 mg via ORAL
  Filled 2014-11-08: qty 2

## 2014-11-08 MED ORDER — SODIUM CHLORIDE 0.9 % IV SOLN
INTRAVENOUS | Status: DC
  Administered 2014-11-08 – 2014-11-10 (×2): via INTRAVENOUS

## 2014-11-08 MED ORDER — DEXTROSE-NACL 5-0.45 % IV SOLN
100.0000 mL/h | INTRAVENOUS | Status: AC
  Administered 2014-11-08: 100 mL/h via INTRAVENOUS

## 2014-11-08 MED ORDER — CEFAZOLIN SODIUM-DEXTROSE 2-3 GM-% IV SOLR: 2.0000 g | INTRAVENOUS | Status: DC

## 2014-11-08 MED ORDER — PROPOFOL 10 MG/ML IV BOLUS
INTRAVENOUS | Status: DC | PRN
  Administered 2014-11-08: 200 mg via INTRAVENOUS

## 2014-11-08 MED ORDER — MORPHINE SULFATE 4 MG/ML IJ SOLN
INTRAMUSCULAR | Status: AC
  Filled 2014-11-08: qty 1

## 2014-11-08 MED ORDER — 0.9 % SODIUM CHLORIDE (POUR BTL) OPTIME
TOPICAL | Status: DC | PRN
  Administered 2014-11-08: 1000 mL

## 2014-11-08 MED ORDER — MIDAZOLAM HCL 5 MG/5ML IJ SOLN
INTRAMUSCULAR | Status: DC | PRN
  Administered 2014-11-08: 2 mg via INTRAVENOUS

## 2014-11-08 MED ORDER — CEFAZOLIN SODIUM 1-5 GM-% IV SOLN
1.0000 g | Freq: Once | INTRAVENOUS | Status: AC
  Administered 2014-11-08: 1 g via INTRAVENOUS
  Filled 2014-11-08: qty 50

## 2014-11-08 MED ORDER — IOHEXOL 300 MG/ML  SOLN
100.0000 mL | Freq: Once | INTRAMUSCULAR | Status: AC | PRN
  Administered 2014-11-08: 100 mL via INTRAVENOUS

## 2014-11-08 MED ORDER — PANTOPRAZOLE SODIUM 40 MG PO TBEC
40.0000 mg | DELAYED_RELEASE_TABLET | Freq: Every day | ORAL | Status: DC
  Administered 2014-11-09: 40 mg via ORAL
  Filled 2014-11-08 (×2): qty 1

## 2014-11-08 MED ORDER — INSULIN DETEMIR 100 UNIT/ML ~~LOC~~ SOLN
30.0000 [IU] | Freq: Every day | SUBCUTANEOUS | Status: DC
  Administered 2014-11-08 – 2014-11-16 (×10): 30 [IU] via SUBCUTANEOUS
  Filled 2014-11-08 (×11): qty 0.3

## 2014-11-08 MED ORDER — INSULIN ASPART 100 UNIT/ML ~~LOC~~ SOLN
0.0000 [IU] | SUBCUTANEOUS | Status: DC
  Administered 2014-11-08: 8 [IU] via SUBCUTANEOUS
  Administered 2014-11-08: 5 [IU] via SUBCUTANEOUS
  Administered 2014-11-08: 3 [IU] via SUBCUTANEOUS
  Administered 2014-11-09: 11 [IU] via SUBCUTANEOUS
  Administered 2014-11-09: 5 [IU] via SUBCUTANEOUS
  Administered 2014-11-09: 2 [IU] via SUBCUTANEOUS
  Administered 2014-11-09: 8 [IU] via SUBCUTANEOUS
  Administered 2014-11-10: 2 [IU] via SUBCUTANEOUS
  Administered 2014-11-10: 3 [IU] via SUBCUTANEOUS
  Administered 2014-11-10: 8 [IU] via SUBCUTANEOUS
  Administered 2014-11-10: 11 [IU] via SUBCUTANEOUS
  Administered 2014-11-11 (×2): 2 [IU] via SUBCUTANEOUS
  Administered 2014-11-11: 8 [IU] via SUBCUTANEOUS
  Administered 2014-11-11 – 2014-11-12 (×2): 2 [IU] via SUBCUTANEOUS
  Administered 2014-11-12: 5 [IU] via SUBCUTANEOUS

## 2014-11-08 MED ORDER — HYDROMORPHONE HCL 1 MG/ML IJ SOLN
0.2500 mg | INTRAMUSCULAR | Status: DC | PRN
  Administered 2014-11-08: 0.25 mg via INTRAVENOUS

## 2014-11-08 MED ORDER — OXYCODONE HCL 5 MG PO TABS: 5.0000 mg | ORAL_TABLET | Freq: Once | ORAL | Status: DC | PRN

## 2014-11-08 MED ORDER — HYDROMORPHONE HCL 1 MG/ML IJ SOLN
INTRAMUSCULAR | Status: AC
  Administered 2014-11-08: 0.25 mg via INTRAVENOUS
  Filled 2014-11-08: qty 1

## 2014-11-08 MED ORDER — MIDAZOLAM HCL 2 MG/2ML IJ SOLN
INTRAMUSCULAR | Status: AC
  Filled 2014-11-08: qty 2

## 2014-11-08 SURGICAL SUPPLY — 54 items
BANDAGE ELASTIC 3 VELCRO ST LF (GAUZE/BANDAGES/DRESSINGS) ×2 IMPLANT
BANDAGE ELASTIC 4 VELCRO ST LF (GAUZE/BANDAGES/DRESSINGS) ×2 IMPLANT
BANDAGE ELASTIC 6 VELCRO ST LF (GAUZE/BANDAGES/DRESSINGS) ×2 IMPLANT
BLADE SURG 10 STRL SS (BLADE) ×2 IMPLANT
BNDG COHESIVE 4X5 TAN STRL (GAUZE/BANDAGES/DRESSINGS) ×2 IMPLANT
BNDG GAUZE ELAST 4 BULKY (GAUZE/BANDAGES/DRESSINGS) ×4 IMPLANT
BOOTCOVER CLEANROOM LRG (PROTECTIVE WEAR) ×4 IMPLANT
COVER SURGICAL LIGHT HANDLE (MISCELLANEOUS) ×2 IMPLANT
CUFF TOURNIQUET SINGLE 34IN LL (TOURNIQUET CUFF) IMPLANT
DRSG VAC ATS MED SENSATRAC (GAUZE/BANDAGES/DRESSINGS) ×2 IMPLANT
DURAPREP 26ML APPLICATOR (WOUND CARE) ×2 IMPLANT
ELECT REM PT RETURN 9FT ADLT (ELECTROSURGICAL)
ELECTRODE REM PT RTRN 9FT ADLT (ELECTROSURGICAL) IMPLANT
EVACUATOR 1/8 PVC DRAIN (DRAIN) IMPLANT
FACESHIELD WRAPAROUND (MASK) ×6 IMPLANT
FACESHIELD WRAPAROUND OR TEAM (MASK) ×3 IMPLANT
GAUZE SPONGE 4X4 12PLY STRL (GAUZE/BANDAGES/DRESSINGS) ×2 IMPLANT
GAUZE XEROFORM 1X8 LF (GAUZE/BANDAGES/DRESSINGS) ×2 IMPLANT
GLOVE BIO SURGEON STRL SZ7 (GLOVE) ×2 IMPLANT
GLOVE BIO SURGEON STRL SZ8 (GLOVE) ×2 IMPLANT
GLOVE BIOGEL PI IND STRL 7.0 (GLOVE) ×1 IMPLANT
GLOVE BIOGEL PI INDICATOR 7.0 (GLOVE) ×1
GLOVE ORTHO TXT STRL SZ7.5 (GLOVE) ×4 IMPLANT
GOWN STRL REUS W/ TWL LRG LVL3 (GOWN DISPOSABLE) ×3 IMPLANT
GOWN STRL REUS W/TWL 2XL LVL3 (GOWN DISPOSABLE) ×2 IMPLANT
GOWN STRL REUS W/TWL LRG LVL3 (GOWN DISPOSABLE) ×6
HANDPIECE INTERPULSE COAX TIP (DISPOSABLE)
KIT BASIN OR (CUSTOM PROCEDURE TRAY) ×2 IMPLANT
KIT ROOM TURNOVER OR (KITS) ×2 IMPLANT
KWIRE 4.0 X .045IN (WIRE) ×2 IMPLANT
MANIFOLD NEPTUNE II (INSTRUMENTS) ×2 IMPLANT
NS IRRIG 1000ML POUR BTL (IV SOLUTION) ×2 IMPLANT
PACK ORTHO EXTREMITY (CUSTOM PROCEDURE TRAY) ×2 IMPLANT
PAD ARMBOARD 7.5X6 YLW CONV (MISCELLANEOUS) ×4 IMPLANT
PAD CAST 3X4 CTTN HI CHSV (CAST SUPPLIES) ×1 IMPLANT
PAD CAST 4YDX4 CTTN HI CHSV (CAST SUPPLIES) ×1 IMPLANT
PAD NEG PRESSURE SENSATRAC (MISCELLANEOUS) ×2 IMPLANT
PADDING CAST COTTON 3X4 STRL (CAST SUPPLIES) ×2
PADDING CAST COTTON 4X4 STRL (CAST SUPPLIES) ×2
PENCIL BUTTON HOLSTER BLD 10FT (ELECTRODE) IMPLANT
SET HNDPC FAN SPRY TIP SCT (DISPOSABLE) IMPLANT
SPLINT FIBERGLASS 3X12 (CAST SUPPLIES) ×2 IMPLANT
SPONGE GAUZE 4X4 12PLY STER LF (GAUZE/BANDAGES/DRESSINGS) ×2 IMPLANT
SPONGE LAP 18X18 X RAY DECT (DISPOSABLE) ×2 IMPLANT
STOCKINETTE IMPERVIOUS 9X36 MD (GAUZE/BANDAGES/DRESSINGS) ×2 IMPLANT
SUT ETHILON 3 0 PS 1 (SUTURE) ×2 IMPLANT
TOWEL OR 17X24 6PK STRL BLUE (TOWEL DISPOSABLE) ×2 IMPLANT
TOWEL OR 17X26 10 PK STRL BLUE (TOWEL DISPOSABLE) ×2 IMPLANT
TOWEL OR NON WOVEN STRL DISP B (DISPOSABLE) ×2 IMPLANT
TUBE ANAEROBIC SPECIMEN COL (MISCELLANEOUS) IMPLANT
TUBE CONNECTING 12X1/4 (SUCTIONS) ×2 IMPLANT
UNDERPAD 30X30 INCONTINENT (UNDERPADS AND DIAPERS) ×2 IMPLANT
WATER STERILE IRR 1000ML POUR (IV SOLUTION) ×2 IMPLANT
YANKAUER SUCT BULB TIP NO VENT (SUCTIONS) ×2 IMPLANT

## 2014-11-08 NOTE — Anesthesia Postprocedure Evaluation (Signed)
Anesthesia Post Note  Patient: JEFFREY GRAEFE  Procedure(s) Performed: Procedure(s) (LRB): IRRIGATION AND DEBRIDEMENT EXTREMITY POSSIBLE WOUND VAC PLACEMENT (Right) CLOSED REDUCTION METACARPAL WITH PERCUTANEOUS PINNING (Right)  Anesthesia type: General  Patient location: PACU  Post pain: Pain level controlled and Adequate analgesia  Post assessment: Post-op Vital signs reviewed, Patient's Cardiovascular Status Stable, Respiratory Function Stable, Patent Airway and Pain level controlled  Last Vitals:  Filed Vitals:   11/08/14 1553  BP: 135/72  Pulse: 108  Temp: 37.2 C  Resp: 12    Post vital signs: Reviewed and stable  Level of consciousness: awake, alert  and oriented  Complications: No apparent anesthesia complications

## 2014-11-08 NOTE — Anesthesia Procedure Notes (Signed)
Procedure Name: LMA Insertion Date/Time: 11/08/2014 1:28 PM Performed by: Izora Gala Pre-anesthesia Checklist: Patient identified, Emergency Drugs available, Suction available and Patient being monitored Patient Re-evaluated:Patient Re-evaluated prior to inductionOxygen Delivery Method: Circle system utilized Preoxygenation: Pre-oxygenation with 100% oxygen Intubation Type: IV induction Ventilation: Mask ventilation without difficulty LMA: LMA inserted LMA Size: 5.0 Tube type: Oral Number of attempts: 1 Placement Confirmation: positive ETCO2 Tube secured with: Tape Dental Injury: Teeth and Oropharynx as per pre-operative assessment

## 2014-11-08 NOTE — ED Notes (Signed)
Report to 5N not 4N
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
## 2014-11-08 NOTE — Op Note (Signed)
[id number] 
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
## 2014-11-08 NOTE — ED Notes (Signed)
Collar removed by Anderson Malta, [place]

## 2014-11-08 NOTE — Anesthesia Preprocedure Evaluation (Addendum)
Anesthesia Evaluation  Patient identified by MRN, date of birth, ID band Patient awake    Reviewed: Allergy & Precautions, NPO status , Patient's Chart, lab work & pertinent test results  History of Anesthesia Complications Negative for: history of anesthetic complications  Airway Mallampati: I   Neck ROM: full    Dental no notable dental hx.    Pulmonary neg pulmonary ROS,    Pulmonary exam normal       Cardiovascular negative cardio ROS  Rhythm:Regular Rate:Tachycardia     Neuro/Psych negative neurological ROS     GI/Hepatic negative GI ROS, Neg liver ROS,   Endo/Other  diabetes, Poorly Controlled, Type 1, Insulin Dependent  Renal/GU negative Renal ROS     Musculoskeletal   Abdominal   Peds  Hematology negative hematology ROS (+)   Anesthesia Other Findings   Reproductive/Obstetrics                            Anesthesia Physical Anesthesia Plan  ASA: II  Anesthesia Plan: General   Post-op Pain Management:    Induction: Intravenous  Airway Management Planned: LMA  Additional Equipment:   Intra-op Plan:   Post-operative Plan:   Informed Consent: I have reviewed the patients History and Physical, chart, labs and discussed the procedure including the risks, benefits and alternatives for the proposed anesthesia with the patient or authorized representative who has indicated his/her understanding and acceptance.   Dental advisory given  Plan Discussed with: CRNA, Anesthesiologist and Surgeon  Anesthesia Plan Comments:        Anesthesia Quick Evaluation
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
## 2014-11-08 NOTE — Brief Op Note (Signed)
11/07/2014 - 11/08/2014  2:18 PM  PATIENT:  [name]  29 y.o. male  PRE-OPERATIVE DIAGNOSIS:  open wound right leg  POST-OPERATIVE DIAGNOSIS:  open wound right leg  PROCEDURE:  Procedure(s): IRRIGATION AND DEBRIDEMENT EXTREMITY POSSIBLE WOUND VAC PLACEMENT (Right) CLOSED REDUCTION METACARPAL WITH PERCUTANEOUS PINNING (Right)  SURGEON:  Surgeon(s) and Role:    * Renette Butters, MD - Primary    * Leanora Cover, MD  PHYSICIAN ASSISTANT:   ASSISTANTS: none   ANESTHESIA:   general  EBL:  Total I/O In: -  Out: 1925 [Urine:1925]  BLOOD ADMINISTERED:none  DRAINS: none   LOCAL MEDICATIONS USED:  NONE  SPECIMEN:  No Specimen  DISPOSITION OF SPECIMEN:  N/A  COUNTS:  YES  TOURNIQUET:   Total Tourniquet Time Documented: Upper Arm (Right) - 25 minutes Total: Upper Arm (Right) - 25 minutes   DICTATION: .Other Dictation: Dictation Number [id number]  PLAN OF CARE: Patient on trauma service  PATIENT DISPOSITION:  PACU - hemodynamically stable.   Delay start of Pharmacological VTE agent (>24hrs) due to surgical blood loss or risk of bleeding: no

## 2014-11-08 NOTE — Op Note (Signed)
11/07/2014 - 11/08/2014  2:11 PM  PATIENT:  [name]    PRE-OPERATIVE DIAGNOSIS:  open wound right leg  POST-OPERATIVE DIAGNOSIS:  Same  PROCEDURE:  IRRIGATION AND DEBRIDEMENT EXTREMITY POSSIBLE WOUND VAC PLACEMENT, CLOSED REDUCTION METACARPAL WITH PERCUTANEOUS PINNING  SURGEON:  Yanixan Mellinger D, MD  ASSISTANT: Lovett Calender, PA-C, She was present and scrubbed throughout the case, critical for completion in a timely fashion, and for retraction, instrumentation, and closure.   ANESTHESIA:   gen  PREOPERATIVE INDICATIONS:  [name] is a  29 y.o. male with a diagnosis of open wound right leg who failed conservative measures and elected for surgical management.    The risks benefits and alternatives were discussed with the patient preoperatively including but not limited to the risks of infection, bleeding, nerve injury, cardiopulmonary complications, the need for revision surgery, among others, and the patient was willing to proceed.  OPERATIVE IMPLANTS: none  OPERATIVE FINDINGS: no violation of the Knee joint  BLOOD LOSS: 16XW   COMPLICATIONS: none  TOURNIQUET TIME: none  OPERATIVE PROCEDURE:  Patient was identified in the preoperative holding area and site was marked by me He was transported to the operating theater and placed on the table in supine position taking care to pad all bony prominences. After a preincinduction time out anesthesia was induced. The right lower extremity was prepped and draped in normal sterile fashion and a pre-incision timeout was performed. He received ancef for preoperative antibiotics.   Please see Dr. Maryjean Morn was note for portion of the case pertaining to his right upper extremity.  I performed a thorough irrigation with 3 L of saline. I then debrided devitalized skin tissue devitalize bursa as well as some foreign body from his wound. I then irrigated  I then irrigated again with 3 L of saline. I probed the remainder of his wound there  was no penetration of the articular capsule of his knee joint. There was a large dead space of degloving distal lateral over his lower leg.  I saw no more foreign body or necrotic tissue.   Wound size 15cm  I then closed the lower portion of his traumatic wound and placed a wound VAC dressing. He was taking the PACU in stable condition.  POST OPERATIVE PLAN: Knee immobilizer full time, WBAT, dvt px: ambulation and chemical per the trauma team  Likely repeat I&D tuesday    This note was generated using a template and dragon dictation system. In light of that, I have reviewed the note and all aspects of it are applicable to this case. Any dictation errors are due to the computerized dictation system.
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
## 2014-11-08 NOTE — Progress Notes (Signed)
Orthopedic Tech Progress Note Patient Details:  [name] [birth year] [id number]  Ortho Devices Type of Ortho Device: Ace wrap, Ulna gutter splint Ortho Device/Splint Interventions: Application   [name], [name] 11/08/2014, 4:37 AM

## 2014-11-08 NOTE — Transfer of Care (Signed)
Immediate Anesthesia Transfer of Care Note  Patient: [name]  Procedure(s) Performed: Procedure(s): IRRIGATION AND DEBRIDEMENT EXTREMITY POSSIBLE WOUND VAC PLACEMENT (Right) CLOSED REDUCTION METACARPAL WITH PERCUTANEOUS PINNING (Right)  Patient Location: PACU  Anesthesia Type:General  Level of Consciousness: sedated, patient cooperative and responds to stimulation  Airway & Oxygen Therapy: Patient Spontanous Breathing and Patient connected to nasal cannula oxygen  Post-op Assessment: Report given to RN, Post -op Vital signs reviewed and stable and Patient moving all extremities  Post vital signs: Reviewed and stable  Last Vitals:  Filed Vitals:   11/08/14 1218  BP: 115/58  Pulse: 100  Temp: 37.3 C  Resp: 14    Complications: No apparent anesthesia complications

## 2014-11-08 NOTE — Op Note (Signed)
Intra-operative fluoroscopic images in the AP, lateral, and oblique views were taken and evaluated by myself.  Reduction and hardware placement were confirmed.  There was no intraarticular penetration of permanent hardware.  

## 2014-11-08 NOTE — Discharge Instructions (Addendum)
WBAT in the RLE In Knee Immobilizer at all times  [facility] Center Instructions Hand Surgery  Wound Care: Keep your hand elevated above the level of your heart.  Do not allow it to dangle by your side.  Keep the dressing dry and do not remove it unless your doctor advises you to do so.  He will usually change it at the time of your post-op visit.  Moving your fingers is advised to stimulate circulation but will depend on the site of your surgery.  If you have a splint applied, your doctor will advise you regarding movement.  Activity: Do not drive or operate machinery today.  Rest today and then you may return to your normal activity and work as indicated by your physician.  Diet:  Drink liquids today or eat a light diet.  You may resume a regular diet tomorrow.    General expectations: Pain for two to three days. Fingers may become slightly swollen.  Call your doctor if any of the following occur: Severe pain not relieved by pain medication. Elevated temperature. Dressing soaked with blood. Inability to move fingers. White or bluish color to fingers.   No driving while taking oxycodone.  Wash wounds daily in shower with soap and water. Do not soak. Apply antibiotic ointment (e.g. Neosporin) twice daily and as needed to keep moist. Cover with dry dressing.

## 2014-11-09 LAB — BASIC METABOLIC PANEL
Anion gap: 9 (ref 5–15)
BUN: 7 mg/dL (ref 6–23)
CO2: 23 mmol/L (ref 19–32)
Calcium: 8.1 mg/dL — ABNORMAL LOW (ref 8.4–10.5)
Chloride: 103 mmol/L (ref 96–112)
Creatinine, Ser: 0.85 mg/dL (ref 0.50–1.35)
GFR calc Af Amer: >90 mL/min (ref >90)
GFR calc non Af Amer: >90 mL/min (ref >90)
Glucose, Bld: 98 mg/dL (ref 70–99)
Potassium: 3.7 mmol/L (ref 3.5–5.1)
Sodium: 135 mmol/L (ref 135–145)

## 2014-11-09 LAB — CBC
HCT: 39.3 % (ref 39.0–52.0)
Hemoglobin: 13.1 g/dL (ref 13.0–17.0)
MCH: 32.5 pg (ref 26.0–34.0)
MCHC: 33.3 g/dL (ref 30.0–36.0)
MCV: 97.5 fL (ref 78.0–100.0)
Platelets: 182 10*3/uL (ref 150–400)
RBC: 4.03 MIL/uL — ABNORMAL LOW (ref 4.22–5.81)
RDW: 12.3 % (ref 11.5–15.5)
WBC: 6.9 10*3/uL (ref 4.0–10.5)

## 2014-11-09 LAB — GLUCOSE, CAPILLARY
Glucose-Capillary: 139 mg/dL — ABNORMAL HIGH (ref 70–99)
Glucose-Capillary: 209 mg/dL — ABNORMAL HIGH (ref 70–99)
Glucose-Capillary: 271 mg/dL — ABNORMAL HIGH (ref 70–99)
Glucose-Capillary: 307 mg/dL — ABNORMAL HIGH (ref 70–99)
Glucose-Capillary: 68 mg/dL — ABNORMAL LOW (ref 70–99)

## 2014-11-09 MED ORDER — HEPARIN SODIUM (PORCINE) 5000 UNIT/ML IJ SOLN
5000.0000 [IU] | Freq: Three times a day (TID) | INTRAMUSCULAR | Status: DC
  Administered 2014-11-09: 5000 [IU] via SUBCUTANEOUS
  Filled 2014-11-09: qty 1

## 2014-11-09 MED ORDER — HEPARIN SODIUM (PORCINE) 5000 UNIT/ML IJ SOLN
5000.0000 [IU] | Freq: Three times a day (TID) | INTRAMUSCULAR | Status: DC
  Administered 2014-11-10 – 2014-11-17 (×20): 5000 [IU] via SUBCUTANEOUS
  Filled 2014-11-09 (×18): qty 1

## 2014-11-09 NOTE — Progress Notes (Signed)
     Subjective:  POD#1 ID of open wound of the right leg.  Patient reports pain as moderate.  Resting comfortably in bed.   Objective:   VITALS:   Filed Vitals:   11/08/14 1553 11/08/14 2112 11/09/14 0025 11/09/14 0540  BP: 135/72 134/82 139/84 132/83  Pulse: 108 91 97 88  Temp: 98.9 F (37.2 C) 98.7 F (37.1 C) 99.1 F (37.3 C) 98.6 F (37 C)  TempSrc: Oral Oral Oral Oral  Resp: 12 16 16 16   Height:      Weight:      SpO2: 96% 97% 97% 95%    Neurologically intact ABD soft Neurovascular intact Sensation intact distally Intact pulses distally Wound vac actively draining off the right leg  Lab Results  Component Value Date   WBC 6.9 11/09/2014   HGB 13.1 11/09/2014   HCT 39.3 11/09/2014   MCV 97.5 11/09/2014   PLT 182 11/09/2014   BMET    Component Value Date/Time   NA 135 11/09/2014 0654   K 3.7 11/09/2014 0654   CL 103 11/09/2014 0654   CO2 23 11/09/2014 0654   GLUCOSE 98 11/09/2014 0654   BUN 7 11/09/2014 0654   CREATININE 0.85 11/09/2014 0654   CALCIUM 8.1* 11/09/2014 0654   GFRNONAA >90 11/09/2014 0654   GFRAA >90 11/09/2014 0654     Assessment/Plan: 1 Day Post-Op   Active Problems:   Acetabular fracture   Metacarpal bone fracture   Ehlers-Danlos disease   Up with therapy Touch down weight bearing the [place] Plan to take back to the OR Tuesday for ID and possible fascia closure of right leg  Dayanna Pryce Marie 11/09/2014, 11:52 AM Cell [phone]
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
## 2014-11-09 NOTE — Progress Notes (Signed)
Pt blood sugar was 68. Orange juice given. Rechecked blood sugar and it was 139. Will continue to monitor
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
## 2014-11-10 ENCOUNTER — Encounter (HOSPITAL_COMMUNITY): Payer: Self-pay | Admitting: Physician Assistant

## 2014-11-10 LAB — GLUCOSE, CAPILLARY
Glucose-Capillary: 106 mg/dL — ABNORMAL HIGH (ref 70–99)
Glucose-Capillary: 150 mg/dL — ABNORMAL HIGH (ref 70–99)
Glucose-Capillary: 182 mg/dL — ABNORMAL HIGH (ref 70–99)
Glucose-Capillary: 239 mg/dL — ABNORMAL HIGH (ref 70–99)
Glucose-Capillary: 342 mg/dL — ABNORMAL HIGH (ref 70–99)
Glucose-Capillary: 89 mg/dL (ref 70–99)

## 2014-11-10 LAB — HEMOGLOBIN A1C
Hgb A1c MFr Bld: 10 % — ABNORMAL HIGH (ref 4.8–5.6)
Mean Plasma Glucose: 240 mg/dL

## 2014-11-10 MED ORDER — DEXTROSE-NACL 5-0.45 % IV SOLN: 100.0000 mL/h | INTRAVENOUS | Status: DC

## 2014-11-10 MED ORDER — ACETAMINOPHEN 500 MG PO TABS: 1000.0000 mg | ORAL_TABLET | Freq: Once | ORAL | Status: DC

## 2014-11-10 MED ORDER — OXYCODONE-ACETAMINOPHEN 5-325 MG PO TABS
1.0000 | ORAL_TABLET | ORAL | Status: DC | PRN
  Administered 2014-11-10 – 2014-11-12 (×7): 2 via ORAL
  Filled 2014-11-10 (×8): qty 2

## 2014-11-10 MED ORDER — CEFAZOLIN SODIUM-DEXTROSE 2-3 GM-% IV SOLR
2.0000 g | INTRAVENOUS | Status: DC
  Filled 2014-11-10: qty 50

## 2014-11-10 MED ORDER — POLYETHYLENE GLYCOL 3350 17 G PO PACK
17.0000 g | PACK | Freq: Every day | ORAL | Status: DC
  Administered 2014-11-12 – 2014-11-17 (×6): 17 g via ORAL
  Filled 2014-11-10 (×7): qty 1

## 2014-11-10 MED ORDER — DEXTROSE-NACL 5-0.9 % IV SOLN
INTRAVENOUS | Status: DC
  Administered 2014-11-10: 23:00:00 via INTRAVENOUS

## 2014-11-10 NOTE — Progress Notes (Signed)
OT Cancellation Note  Patient Details Name: KHYRI HINZMAN MRN: [id number] DOB: 10-18-1985   Cancelled Treatment:    Reason Eval/Treat Not Completed: Pain limiting ability to participate.  Pt. Declines skilled OT at this time secondary to awaiting pain meds and states he wants to have enough time for them to "kick in" before he moves oob.  Will have another therapist check back on him as able.   Janice Coffin, COTA/L 11/10/2014, 8:45 AM
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
## 2014-11-10 NOTE — Progress Notes (Signed)
     Subjective:  POD#2 ID of open wound of right leg. Patient reports pain as moderate. Resting comfortably in bed.  Objective:   VITALS:   Filed Vitals:   11/09/14 0540 11/09/14 1403 11/09/14 2104 11/10/14 0459  BP: 132/83 135/81 144/91 143/90  Pulse: 88 84 90 74  Temp: 98.6 F (37 C) 98.1 F (36.7 C) 98.4 F (36.9 C) 98.3 F (36.8 C)  TempSrc: Oral Oral Oral Oral  Resp: 16 18 18 18   Height:      Weight:      SpO2: 95% 96% 99% 98%    Neurologically intact ABD soft Neurovascular intact Sensation intact distally Intact pulses distally Dorsiflexion/Plantar flexion intact  Wound vac actively draining from the right leg   Lab Results  Component Value Date   WBC 6.9 11/09/2014   HGB 13.1 11/09/2014   HCT 39.3 11/09/2014   MCV 97.5 11/09/2014   PLT 182 11/09/2014   BMET    Component Value Date/Time   NA 135 11/09/2014 0654   K 3.7 11/09/2014 0654   CL 103 11/09/2014 0654   CO2 23 11/09/2014 0654   GLUCOSE 98 11/09/2014 0654   BUN 7 11/09/2014 0654   CREATININE 0.85 11/09/2014 0654   CALCIUM 8.1* 11/09/2014 0654   GFRNONAA >90 11/09/2014 0654   GFRAA >90 11/09/2014 0654     Assessment/Plan: 2 Days Post-Op   Active Problems:   Acetabular fracture   Metacarpal bone fracture   Ehlers-Danlos disease   Up with therapy Touch down weight bearing on the RLE Anterior hip precautions Plan to take to the OR tomorrow 4/5 for ID and possible fascia closure of right leg   [name] 11/10/2014, 8:00 AM Cell [phone]

## 2014-11-10 NOTE — Progress Notes (Signed)
Inpatient Diabetes Program Recommendations  AACE/ADA: New Consensus Statement on Inpatient Glycemic Control (2013)  Target Ranges:  Prepandial:   less than 140 mg/dL      Peak postprandial:   less than 180 mg/dL (1-2 hours)      Critically ill patients:  140 - 180 mg/dL   Results for MCDONALD, REILING (MRN [id number]) as of 11/10/2014 10:26  Ref. Range 11/09/2014 07:16 11/09/2014 17:06 11/09/2014 21:34 11/10/2014 00:20 11/10/2014 04:22 11/10/2014 07:49  Glucose-Capillary Latest Range: 70-99 mg/dL 139 (H) 271 (H) 209 (H) 150 (H) 106 (H) 89    Diabetes history: DM1 Outpatient Diabetes medications: Levemir 28 units QHS, Novolog 8-14 units TID with meals plus sliding scale correction Current orders for Inpatient glycemic control: Levemir 30 units QHS, Novolog 0-15 units Q4H  Inpatient Diabetes Program Recommendations Insulin - Meal Coverage: Please consider ordering Novolog 4 units TID with meals for meal coverage (in addition to Novolog correction scale).  Note: In reviewing the chart, note patient currently had diet ordered but will be NPO after midnight since he is scheduled to go back to OR on 11/11/14. At this time, please consider ordering Novolog 4 units TID with meals for meal coverage (in addition to Novolog correction scale). Will continue to follow.  Thanks, Barnie Alderman, RN, MSN, CCRN, CDE Diabetes Coordinator Inpatient Diabetes Program [phone] (Team Pager from [place] to [place]) [phone] ([place] office) [phone] [facility] office)
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
## 2014-11-11 ENCOUNTER — Encounter (HOSPITAL_COMMUNITY): Admission: EM | Disposition: A | Discharge status: Home / Self Care | Payer: Self-pay | Source: Home / Self Care

## 2014-11-11 ENCOUNTER — Inpatient Hospital Stay (HOSPITAL_COMMUNITY): Payer: BLUE CROSS/BLUE SHIELD | Admitting: Certified Registered Nurse Anesthetist

## 2014-11-11 ENCOUNTER — Encounter (HOSPITAL_COMMUNITY): Payer: Self-pay | Admitting: General Practice

## 2014-11-11 DIAGNOSIS — IMO0002 Reserved for concepts with insufficient information to code with codable children: Secondary | ICD-10-CM | POA: Diagnosis present

## 2014-11-11 DIAGNOSIS — Q796 Ehlers-Danlos syndrome, unspecified: Secondary | ICD-10-CM | POA: Insufficient documentation

## 2014-11-11 HISTORY — PX: I&D EXTREMITY: SHX5045

## 2014-11-11 LAB — GLUCOSE, CAPILLARY
Glucose-Capillary: 135 mg/dL — ABNORMAL HIGH (ref 70–99)
Glucose-Capillary: 142 mg/dL — ABNORMAL HIGH (ref 70–99)
Glucose-Capillary: 150 mg/dL — ABNORMAL HIGH (ref 70–99)
Glucose-Capillary: 156 mg/dL — ABNORMAL HIGH (ref 70–99)
Glucose-Capillary: 175 mg/dL — ABNORMAL HIGH (ref 70–99)
Glucose-Capillary: 289 mg/dL — ABNORMAL HIGH (ref 70–99)
Glucose-Capillary: 99 mg/dL (ref 70–99)

## 2014-11-11 SURGERY — IRRIGATION AND DEBRIDEMENT EXTREMITY
Anesthesia: General | Laterality: Right

## 2014-11-11 MED ORDER — PROMETHAZINE HCL 25 MG/ML IJ SOLN: 6.2500 mg | INTRAMUSCULAR | Status: DC | PRN

## 2014-11-11 MED ORDER — KETOROLAC TROMETHAMINE 30 MG/ML IJ SOLN
INTRAMUSCULAR | Status: AC
  Administered 2014-11-11: 30 mg via INTRAVENOUS
  Filled 2014-11-11: qty 1

## 2014-11-11 MED ORDER — ONDANSETRON HCL 4 MG/2ML IJ SOLN
INTRAMUSCULAR | Status: DC | PRN
  Administered 2014-11-11: 4 mg via INTRAVENOUS

## 2014-11-11 MED ORDER — STERILE WATER FOR INJECTION IJ SOLN
INTRAMUSCULAR | Status: AC
  Filled 2014-11-11: qty 10

## 2014-11-11 MED ORDER — MIDAZOLAM HCL 2 MG/2ML IJ SOLN
INTRAMUSCULAR | Status: AC
  Filled 2014-11-11: qty 2

## 2014-11-11 MED ORDER — FENTANYL CITRATE 0.05 MG/ML IJ SOLN
INTRAMUSCULAR | Status: DC | PRN
  Administered 2014-11-11 (×3): 50 ug via INTRAVENOUS

## 2014-11-11 MED ORDER — LIDOCAINE HCL (CARDIAC) 20 MG/ML IV SOLN
INTRAVENOUS | Status: AC
  Filled 2014-11-11: qty 5

## 2014-11-11 MED ORDER — FENTANYL CITRATE 0.05 MG/ML IJ SOLN
25.0000 ug | INTRAMUSCULAR | Status: DC | PRN
  Administered 2014-11-11: 25 ug via INTRAVENOUS

## 2014-11-11 MED ORDER — FENTANYL CITRATE 0.05 MG/ML IJ SOLN
INTRAMUSCULAR | Status: AC
  Filled 2014-11-11: qty 2

## 2014-11-11 MED ORDER — FENTANYL CITRATE 0.05 MG/ML IJ SOLN
INTRAMUSCULAR | Status: AC
  Filled 2014-11-11: qty 5

## 2014-11-11 MED ORDER — LIDOCAINE HCL (CARDIAC) 10 MG/ML IV SOLN
INTRAVENOUS | Status: DC | PRN
  Administered 2014-11-11: 50 mg via INTRAVENOUS

## 2014-11-11 MED ORDER — ONDANSETRON HCL 4 MG/2ML IJ SOLN
INTRAMUSCULAR | Status: AC
  Filled 2014-11-11: qty 2

## 2014-11-11 MED ORDER — MIDAZOLAM HCL 5 MG/5ML IJ SOLN
INTRAMUSCULAR | Status: DC | PRN
  Administered 2014-11-11: 2 mg via INTRAVENOUS

## 2014-11-11 MED ORDER — PROPOFOL 10 MG/ML IV BOLUS
INTRAVENOUS | Status: DC | PRN
  Administered 2014-11-11: 200 mg via INTRAVENOUS

## 2014-11-11 MED ORDER — LACTATED RINGERS IV SOLN
INTRAVENOUS | Status: DC | PRN
  Administered 2014-11-11: 12:00:00 via INTRAVENOUS

## 2014-11-11 MED ORDER — CEFAZOLIN SODIUM 1-5 GM-% IV SOLN: 1.0000 g | Freq: Four times a day (QID) | INTRAVENOUS | Status: DC

## 2014-11-11 MED ORDER — POLYMYXIN B SULFATE 500000 UNITS IJ SOLR
INTRAMUSCULAR | Status: DC | PRN
  Administered 2014-11-11: 500 mL

## 2014-11-11 MED ORDER — LACTATED RINGERS IV SOLN
INTRAVENOUS | Status: DC
  Administered 2014-11-11 – 2014-11-13 (×3): via INTRAVENOUS

## 2014-11-11 MED ORDER — KETOROLAC TROMETHAMINE 30 MG/ML IJ SOLN
30.0000 mg | Freq: Once | INTRAMUSCULAR | Status: AC | PRN
Start: 2014-11-11 — End: 2014-11-11
  Administered 2014-11-11: 30 mg via INTRAVENOUS

## 2014-11-11 MED ORDER — PROPOFOL 10 MG/ML IV BOLUS
INTRAVENOUS | Status: AC
  Filled 2014-11-11: qty 20

## 2014-11-11 SURGICAL SUPPLY — 58 items
BAG DECANTER FOR FLEXI CONT (MISCELLANEOUS) ×2 IMPLANT
BANDAGE ELASTIC 4 VELCRO ST LF (GAUZE/BANDAGES/DRESSINGS) ×2 IMPLANT
BANDAGE ELASTIC 6 VELCRO ST LF (GAUZE/BANDAGES/DRESSINGS) ×2 IMPLANT
BLADE SURG 10 STRL SS (BLADE) ×2 IMPLANT
BNDG ADH 5X4 AIR PERM ELC (GAUZE/BANDAGES/DRESSINGS) ×1
BNDG COHESIVE 4X5 TAN STRL (GAUZE/BANDAGES/DRESSINGS) ×2 IMPLANT
BNDG COHESIVE 4X5 WHT NS (GAUZE/BANDAGES/DRESSINGS) ×2 IMPLANT
BNDG GAUZE ELAST 4 BULKY (GAUZE/BANDAGES/DRESSINGS) ×2 IMPLANT
BOOTCOVER CLEANROOM LRG (PROTECTIVE WEAR) ×4 IMPLANT
COVER SURGICAL LIGHT HANDLE (MISCELLANEOUS) ×2 IMPLANT
CUFF TOURNIQUET SINGLE 34IN LL (TOURNIQUET CUFF) IMPLANT
DRAIN CHANNEL 10M FLAT 3/4 FLT (DRAIN) ×2 IMPLANT
DRAPE PROXIMA HALF (DRAPES) ×2 IMPLANT
DRSG ADAPTIC 3X8 NADH LF (GAUZE/BANDAGES/DRESSINGS) ×2 IMPLANT
DURAPREP 26ML APPLICATOR (WOUND CARE) ×2 IMPLANT
ELECT REM PT RETURN 9FT ADLT (ELECTROSURGICAL)
ELECTRODE REM PT RTRN 9FT ADLT (ELECTROSURGICAL) IMPLANT
EVACUATOR 1/8 PVC DRAIN (DRAIN) IMPLANT
EVACUATOR SILICONE 100CC (DRAIN) ×2 IMPLANT
FACESHIELD WRAPAROUND (MASK) ×6 IMPLANT
FACESHIELD WRAPAROUND OR TEAM (MASK) ×3 IMPLANT
GAUZE SPONGE 4X4 12PLY STRL (GAUZE/BANDAGES/DRESSINGS) ×2 IMPLANT
GAUZE XEROFORM 1X8 LF (GAUZE/BANDAGES/DRESSINGS) ×2 IMPLANT
GLOVE BIO SURGEON STRL SZ7 (GLOVE) ×10 IMPLANT
GLOVE BIO SURGEON STRL SZ8 (GLOVE) ×2 IMPLANT
GLOVE BIOGEL PI IND STRL 7.0 (GLOVE) ×2 IMPLANT
GLOVE BIOGEL PI INDICATOR 7.0 (GLOVE) ×2
GLOVE ORTHO TXT STRL SZ7.5 (GLOVE) ×4 IMPLANT
GOWN STRL REUS W/ TWL LRG LVL3 (GOWN DISPOSABLE) ×3 IMPLANT
GOWN STRL REUS W/TWL 2XL LVL3 (GOWN DISPOSABLE) ×2 IMPLANT
GOWN STRL REUS W/TWL LRG LVL3 (GOWN DISPOSABLE) ×6
HANDPIECE INTERPULSE COAX TIP (DISPOSABLE)
KIT BASIN OR (CUSTOM PROCEDURE TRAY) ×2 IMPLANT
KIT ROOM TURNOVER OR (KITS) ×2 IMPLANT
MANIFOLD NEPTUNE II (INSTRUMENTS) ×2 IMPLANT
MICROMATRIX 500MG (Tissue) ×2 IMPLANT
NS IRRIG 1000ML POUR BTL (IV SOLUTION) ×2 IMPLANT
PACK ORTHO EXTREMITY (CUSTOM PROCEDURE TRAY) ×2 IMPLANT
PAD ARMBOARD 7.5X6 YLW CONV (MISCELLANEOUS) ×4 IMPLANT
PAD NEG PRESSURE SENSATRAC (MISCELLANEOUS) ×2 IMPLANT
PENCIL BUTTON HOLSTER BLD 10FT (ELECTRODE) IMPLANT
SET HNDPC FAN SPRY TIP SCT (DISPOSABLE) IMPLANT
SOLUTION PARTIC MCRMTRX 500MG (Tissue) ×1 IMPLANT
SPONGE GAUZE 4X4 12PLY STER LF (GAUZE/BANDAGES/DRESSINGS) ×2 IMPLANT
SPONGE LAP 18X18 X RAY DECT (DISPOSABLE) ×2 IMPLANT
STOCKINETTE IMPERVIOUS 9X36 MD (GAUZE/BANDAGES/DRESSINGS) ×2 IMPLANT
SUT ETHILON 3 0 FSL (SUTURE) ×1 IMPLANT
SUT ETHILON 3 0 PS 1 (SUTURE) IMPLANT
SUT MNCRL AB 4-0 PS2 18 (SUTURE) ×10 IMPLANT
SUT MON AB 5-0 PS2 18 (SUTURE) ×2 IMPLANT
TOWEL OR 17X24 6PK STRL BLUE (TOWEL DISPOSABLE) ×2 IMPLANT
TOWEL OR 17X26 10 PK STRL BLUE (TOWEL DISPOSABLE) ×2 IMPLANT
TOWEL OR NON WOVEN STRL DISP B (DISPOSABLE) ×2 IMPLANT
TUBE ANAEROBIC SPECIMEN COL (MISCELLANEOUS) IMPLANT
TUBE CONNECTING 12X1/4 (SUCTIONS) ×2 IMPLANT
UNDERPAD 30X30 INCONTINENT (UNDERPADS AND DIAPERS) ×2 IMPLANT
WATER STERILE IRR 1000ML POUR (IV SOLUTION) ×2 IMPLANT
YANKAUER SUCT BULB TIP NO VENT (SUCTIONS) ×2 IMPLANT

## 2014-11-11 NOTE — Transfer of Care (Signed)
Immediate Anesthesia Transfer of Care Note  Patient: [name]  Procedure(s) Performed: Procedure(s): IRRIGATION AND DEBRIDEMENT EXTREMITY POSSIBLE WOUND VAC PLACEMENT (Right)  Patient Location: PACU  Anesthesia Type:General  Level of Consciousness: awake and patient cooperative  Airway & Oxygen Therapy: Patient Spontanous Breathing and Patient connected to nasal cannula oxygen  Post-op Assessment: Report given to RN, Post -op Vital signs reviewed and stable and Patient moving all extremities X 4  Post vital signs: Reviewed and stable  Last Vitals:  Filed Vitals:   11/11/14 1047  BP: 135/82  Pulse: 90  Temp: 37 C  Resp: 18    Complications: No apparent anesthesia complications
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
## 2014-11-11 NOTE — Clinical Social Work Note (Signed)
CSW went by patient's room to assess and complete SBIRT. Patient still asleep. CSW notes that patient was in surgery this afternoon. CSW will return to assess patient at a more appropriate time.   Liz Beach MSW, [place], [place], [phone]
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
## 2014-11-11 NOTE — H&P (View-Only) (Signed)
     Subjective:  POD#2 ID of open wound of right leg. Patient reports pain as moderate. Resting comfortably in bed.  Objective:   VITALS:   Filed Vitals:   11/09/14 0540 11/09/14 1403 11/09/14 2104 11/10/14 0459  BP: 132/83 135/81 144/91 143/90  Pulse: 88 84 90 74  Temp: 98.6 F (37 C) 98.1 F (36.7 C) 98.4 F (36.9 C) 98.3 F (36.8 C)  TempSrc: Oral Oral Oral Oral  Resp: 16 18 18 18   Height:      Weight:      SpO2: 95% 96% 99% 98%    Neurologically intact ABD soft Neurovascular intact Sensation intact distally Intact pulses distally Dorsiflexion/Plantar flexion intact  Wound vac actively draining from the right leg   Lab Results  Component Value Date   WBC 6.9 11/09/2014   HGB 13.1 11/09/2014   HCT 39.3 11/09/2014   MCV 97.5 11/09/2014   PLT 182 11/09/2014   BMET    Component Value Date/Time   NA 135 11/09/2014 0654   K 3.7 11/09/2014 0654   CL 103 11/09/2014 0654   CO2 23 11/09/2014 0654   GLUCOSE 98 11/09/2014 0654   BUN 7 11/09/2014 0654   CREATININE 0.85 11/09/2014 0654   CALCIUM 8.1* 11/09/2014 0654   GFRNONAA >90 11/09/2014 0654   GFRAA >90 11/09/2014 0654     Assessment/Plan: 2 Days Post-Op   Active Problems:   Acetabular fracture   Metacarpal bone fracture   Ehlers-Danlos disease   Up with therapy Touch down weight bearing on the RLE Anterior hip precautions Plan to take to the OR tomorrow 4/5 for ID and possible fascia closure of right leg   [name] 11/10/2014, 8:00 AM Cell [phone]

## 2014-11-11 NOTE — Progress Notes (Signed)
Inpatient Diabetes Program Recommendations  AACE/ADA: New Consensus Statement on Inpatient Glycemic Control (2013)  Target Ranges:  Prepandial:   less than 140 mg/dL      Peak postprandial:   less than 180 mg/dL (1-2 hours)      Critically ill patients:  140 - 180 mg/dL   Results for DESMIN, DALEO (MRN [id number]) as of 11/11/2014 08:37  Ref. Range 11/10/2014 07:49 11/10/2014 12:36 11/10/2014 16:46 11/10/2014 20:14 11/11/2014 00:25 11/11/2014 04:30  Glucose-Capillary Latest Range: 70-99 mg/dL 89 182 (H) 342 (H) 239 (H) 175 (H) 135 (H)    Diabetes history: DM1 Outpatient Diabetes medications: Levemir 28 units QHS, Novolog 8-14 units TID with meals plus sliding scale correction Current orders for Inpatient glycemic control: Levemir 30 units QHS, Novolog 0-15 units Q4H  Inpatient Diabetes Program Recommendations Insulin - Meal Coverage: Please consider ordering Novolog 4 units TID with meals for meal coverage (in addition to Novolog correction scale).  Note: Note patient is NPO for OR procedure today. After procedure complete and diet is resumed, please order meal coverage. As patient is a Type 1 diabetic and will require insulin coverage for carbohydrates.  Thanks, Barnie Alderman, RN, MSN, CCRN, CDE Diabetes Coordinator Inpatient Diabetes Program [phone] (Team Pager from [place] to [place]) [phone] ([facility] office) [phone] [facility] office)
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
## 2014-11-11 NOTE — Progress Notes (Signed)
UR completed.  Alyssia Heese, RN BSN [facility] CCM Trauma/Neuro ICU Case Manager [phone]  

## 2014-11-11 NOTE — Anesthesia Preprocedure Evaluation (Signed)
Anesthesia Evaluation  Patient identified by MRN, date of birth, ID band Patient awake    Reviewed: Allergy & Precautions, NPO status , Patient's Chart, lab work & pertinent test results  Airway Mallampati: II  TM Distance: >3 FB Neck ROM: Full    Dental no notable dental hx.    Pulmonary neg pulmonary ROS,  breath sounds clear to auscultation  Pulmonary exam normal       Cardiovascular negative cardio ROS  Rhythm:Regular Rate:Normal     Neuro/Psych negative neurological ROS  negative psych ROS   GI/Hepatic negative GI ROS, Neg liver ROS,   Endo/Other  diabetes, Type 1, Insulin Dependent  Renal/GU negative Renal ROS  negative genitourinary   Musculoskeletal negative musculoskeletal ROS (+)   Abdominal   Peds negative pediatric ROS (+)  Hematology negative hematology ROS (+)   Anesthesia Other Findings   Reproductive/Obstetrics negative OB ROS                             Anesthesia Physical Anesthesia Plan  ASA: II  Anesthesia Plan: General   Post-op Pain Management:    Induction: Intravenous  Airway Management Planned: LMA  Additional Equipment:   Intra-op Plan:   Post-operative Plan: Extubation in OR  Informed Consent: I have reviewed the patients History and Physical, chart, labs and discussed the procedure including the risks, benefits and alternatives for the proposed anesthesia with the patient or authorized representative who has indicated his/her understanding and acceptance.   Dental advisory given  Plan Discussed with: CRNA and Surgeon  Anesthesia Plan Comments:         Anesthesia Quick Evaluation

## 2014-11-11 NOTE — Op Note (Signed)
11/07/2014 - 11/11/2014  1:19 PM  PATIENT:  [name]    PRE-OPERATIVE DIAGNOSIS:  open wound right leg  POST-OPERATIVE DIAGNOSIS:  Same  PROCEDURE:  IRRIGATION AND DEBRIDEMENT EXTREMITY POSSIBLE WOUND VAC PLACEMENT  SURGEON:  Yehudah Standing, Ernesta Amble, MD  ASSISTANT: Theodoro Kos MD  ANESTHESIA:   gen   PREOPERATIVE INDICATIONS:  [name] is a  29 y.o. male with a diagnosis of open wound right leg who failed conservative measures and elected for surgical management.    The risks benefits and alternatives were discussed with the patient preoperatively including but not limited to the risks of infection, bleeding, nerve injury, cardiopulmonary complications, the need for revision surgery, among others, and the patient was willing to proceed.  OPERATIVE IMPLANTS: 1Gram Acell powder  OPERATIVE FINDINGS: 16cm laceratoin  BLOOD LOSS: 20  COMPLICATIONS: none  TOURNIQUET TIME: none  OPERATIVE PROCEDURE:  Patient was identified in the preoperative holding area and site was marked by me He was transported to the operating theater and placed on the table in supine position taking care to pad all bony prominences. After a preincinduction time out anesthesia was induced. The right lower extremity was prepped and draped in normal sterile fashion and a pre-incision timeout was performed. He received ancef for preoperative antibiotics.   v after removing as previously placed stitches wound was explored thoroughly and there was no contamination noted. No purulent tissue. Skin edges were debrided as well as some devitalized deep last tissue.  Thorough irrigation was performed with 1 L of fluid.  Next the 1 g of a cell powder was placed in the wound wouldn't in all of the dead spaces. A drain was placed.  Next a layered closure that was complex was performed of his wound of roughly 16 cm. Suction dressing was applied he was placed in a knee immobilizer and taken the PACU in stable condition.    POST OPERATIVE PLAN: WBAT, Knee immobilizer full time    This note was generated using a template and dragon dictation system. In light of that, I have reviewed the note and all aspects of it are applicable to this case. Any dictation errors are due to the computerized dictation system.

## 2014-11-11 NOTE — Progress Notes (Signed)
UR completed.  Carrissa Taitano, RN BSN [facility] CCM Trauma/Neuro ICU Case Manager [phone]  

## 2014-11-11 NOTE — Interval H&P Note (Signed)
History and Physical Interval Note:  11/11/2014 7:55 AM  [name]  has presented today for surgery, with the diagnosis of open wound right leg  The various methods of treatment have been discussed with the patient and family. After consideration of risks, benefits and other options for treatment, the patient has consented to  Procedure(s): IRRIGATION AND DEBRIDEMENT EXTREMITY POSSIBLE WOUND VAC PLACEMENT (Right) as a surgical intervention .  The patient's history has been reviewed, patient examined, no change in status, stable for surgery.  I have reviewed the patient's chart and labs.  Questions were answered to the patient's satisfaction.     Shajuan Musso D

## 2014-11-11 NOTE — Anesthesia Procedure Notes (Signed)
Procedure Name: LMA Insertion Date/Time: 11/11/2014 12:18 PM Performed by: Rogers Blocker Pre-anesthesia Checklist: Patient identified, Emergency Drugs available, Suction available, Patient being monitored and Timeout performed Patient Re-evaluated:Patient Re-evaluated prior to inductionOxygen Delivery Method: Circle system utilized Preoxygenation: Pre-oxygenation with 100% oxygen Intubation Type: IV induction Ventilation: Mask ventilation without difficulty LMA: LMA inserted LMA Size: 5.0 Tube type: Oral Number of attempts: 1 Placement Confirmation: positive ETCO2 and breath sounds checked- equal and bilateral Tube secured with: Tape Dental Injury: Teeth and Oropharynx as per pre-operative assessment

## 2014-11-12 ENCOUNTER — Encounter (HOSPITAL_COMMUNITY): Payer: Self-pay | Admitting: Orthopedic Surgery

## 2014-11-12 LAB — GLUCOSE, CAPILLARY
Glucose-Capillary: 132 mg/dL — ABNORMAL HIGH (ref 70–99)
Glucose-Capillary: 146 mg/dL — ABNORMAL HIGH (ref 70–99)
Glucose-Capillary: 17 mg/dL — CL (ref 70–99)
Glucose-Capillary: 219 mg/dL — ABNORMAL HIGH (ref 70–99)
Glucose-Capillary: 253 mg/dL — ABNORMAL HIGH (ref 70–99)
Glucose-Capillary: 270 mg/dL — ABNORMAL HIGH (ref 70–99)
Glucose-Capillary: 373 mg/dL — ABNORMAL HIGH (ref 70–99)
Glucose-Capillary: 52 mg/dL — ABNORMAL LOW (ref 70–99)

## 2014-11-12 MED ORDER — INSULIN ASPART 100 UNIT/ML ~~LOC~~ SOLN
0.0000 [IU] | Freq: Three times a day (TID) | SUBCUTANEOUS | Status: DC
Start: 2014-11-12 — End: 2014-11-17
  Administered 2014-11-12 (×2): 5 [IU] via SUBCUTANEOUS
  Administered 2014-11-13 (×3): 2 [IU] via SUBCUTANEOUS
  Administered 2014-11-14: 1 [IU] via SUBCUTANEOUS
  Administered 2014-11-14: 5 [IU] via SUBCUTANEOUS
  Administered 2014-11-14: 3 [IU] via SUBCUTANEOUS
  Administered 2014-11-15: 9 [IU] via SUBCUTANEOUS
  Administered 2014-11-15: 5 [IU] via SUBCUTANEOUS
  Administered 2014-11-15 – 2014-11-16 (×2): 2 [IU] via SUBCUTANEOUS
  Administered 2014-11-16: 5 [IU] via SUBCUTANEOUS
  Administered 2014-11-17: 1 [IU] via SUBCUTANEOUS
  Administered 2014-11-17: 7 [IU] via SUBCUTANEOUS
  Administered 2014-11-17: 2 [IU] via SUBCUTANEOUS

## 2014-11-12 MED ORDER — HYDROMORPHONE HCL 1 MG/ML IJ SOLN
0.5000 mg | INTRAMUSCULAR | Status: DC | PRN
  Administered 2014-11-12 – 2014-11-13 (×3): 1 mg via INTRAVENOUS
  Filled 2014-11-12 (×3): qty 1

## 2014-11-12 MED ORDER — INSULIN ASPART 100 UNIT/ML ~~LOC~~ SOLN
0.0000 [IU] | Freq: Every day | SUBCUTANEOUS | Status: DC
  Administered 2014-11-12 – 2014-11-15 (×3): 5 [IU] via SUBCUTANEOUS
  Administered 2014-11-15: 4 [IU] via SUBCUTANEOUS

## 2014-11-12 MED ORDER — INSULIN ASPART 100 UNIT/ML ~~LOC~~ SOLN
4.0000 [IU] | Freq: Three times a day (TID) | SUBCUTANEOUS | Status: DC
  Administered 2014-11-12 – 2014-11-14 (×7): 4 [IU] via SUBCUTANEOUS
  Administered 2014-11-15: 100 [IU] via SUBCUTANEOUS
  Administered 2014-11-15 – 2014-11-17 (×8): 4 [IU] via SUBCUTANEOUS

## 2014-11-12 MED ORDER — OXYCODONE HCL 5 MG PO TABS
10.0000 mg | ORAL_TABLET | ORAL | Status: DC | PRN
Start: 2014-11-12 — End: 2014-11-17
  Administered 2014-11-12: 20 mg via ORAL
  Administered 2014-11-12 (×2): 10 mg via ORAL
  Administered 2014-11-13 – 2014-11-14 (×5): 20 mg via ORAL
  Administered 2014-11-14 (×2): 10 mg via ORAL
  Administered 2014-11-14 – 2014-11-15 (×2): 20 mg via ORAL
  Administered 2014-11-15 – 2014-11-17 (×9): 10 mg via ORAL
  Filled 2014-11-12: qty 2
  Filled 2014-11-12 (×3): qty 4
  Filled 2014-11-12: qty 2
  Filled 2014-11-12: qty 4
  Filled 2014-11-12: qty 2
  Filled 2014-11-12 (×3): qty 4
  Filled 2014-11-12 (×4): qty 2
  Filled 2014-11-12: qty 4
  Filled 2014-11-12 (×5): qty 2
  Filled 2014-11-12: qty 4
  Filled 2014-11-12: qty 2
  Filled 2014-11-12: qty 4
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
## 2014-11-12 NOTE — Progress Notes (Signed)
Clinical Social Work Department BRIEF PSYCHOSOCIAL ASSESSMENT 11/12/2014  Patient:  [name], [name]     Account Number:  [id number]     Admit date:  11/07/2014  Clinical Social Worker:  Ulyess Blossom  Date/Time:  11/12/2014 10:40 PM  Referred by:  CSW  Date Referred:  11/12/2014 Referred for  Psychosocial assessment   Other Referral:   SBIRT.   Interview type:  Patient Other interview type:   Database review.    PSYCHOSOCIAL DATA Living Status:  ALONE Admitted from facility:   Level of care:   Primary support name:  Minette Brine Primary support relationship to patient:  PARENT Degree of support available:   Pt reports good family support from his parents and his girlfriend.    CURRENT CONCERNS Current Concerns  None Noted   Other Concerns:   SBIRT completed.  Pt denies drinking alcohol or abusing any other substances.    SOCIAL WORK ASSESSMENT / PLAN CSW met with to explain role of CSW/discharge planning.  Pt lives at home alone and was I with ALDs PTA.  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  He is insured with [employer].  Pt states that he will be going home at discharge and his mother/girlfriend will providing him with 24 hr assistance/supervision.  Pt unable to identify any SW need/concerns re: d/c planning.  CSW to sign off.   Assessment/plan status:  No Further Intervention Required Other assessment/ plan:   Information/referral to community resources:    PATIENT'S/FAMILY'S RESPONSE TO PLAN OF CARE: Pt was groggy, although communicative and cooperative with interviewer.  He is anxious to be discharged home and grateful that his injuries weren't more serious.

## 2014-11-12 NOTE — Progress Notes (Signed)
Patients wound vac having no output since placement. Suction maintained at 125 with no breaks in seal noted. PA Lovett Calender made aware and instructed RN to continue to monitor at this time.

## 2014-11-12 NOTE — Progress Notes (Signed)
Spoke with patient about diabetes and home regimen for diabetes control. Patient reports that he currently does not have an Endocrinologist following him for diabetes management. Patient states that he was seeing Dr. Cruzita Lederer but had "an incident under her care and will not be seeing her anymore." Inquired about plans for establishing care with a new Endocrinologist and patient reports that he was going to see about seeing Dr. Buddy Duty but he has not been able to make an appointment with Dr. Buddy Duty (states he missed Dr. Cindra Eves office calls while he was at work and then when he has tried to call Dr. Cindra Eves office back they were closed). Currently he takes Levemir 30 units QHS and Novolog 14 units TID with meals as an outpatient for diabetes control. Inquired about how glucose trends at home. Patient states that he has not checked his glucose in a "long time" and he does not recall what his last glucose values were or when he last checked his glucose.   Inquired about knowledge about A1C and patient reports that he knows what an A1C is and reports that his last A1C was in the 9% range. Discussed A1C results (10.0% on 11/08/14) and reviewed how A1C correlates to actual glucose values (glucose trending around 240's mg/dl based on A1C).  Discussed importance of diabetes control and checking CBGs and maintaining good CBG control to prevent long-term and short-term complications. Stressed importance of checking glucose to determine how glucose is running and how much insulin he needs to take. Reviewed basic pathophysiology of Type 1 diabetes and the need to do corrections for glucose if needed and for carb coverage with meals. Discussed impact of nutrition, exercise, stress, sickness, and medications on diabetes control. Inquired about barriers to checking glucose and patient states "just have not done it, no good reason". Explained that in order for a doctor to help him get his diabetes under control they would need him to check  his glucose at least 4 times a day to identify trends and make adjustments with his insulin regimen. Patient states that he would like a list of current Endocrinologist in the [place] area so he can establish care with a new Endocrinologist. Stressed that he needs to be followed by an Endocrinologist especially since he has Type 1 DM so they can help him develop an individualized plan for him to get diabetes controlled.  Encouraged patient to get serious about diabetes management by checking glucose at least 4 times per day and keeping a log of glucose values and amount of insulin taken so that the doctor can help him improve glycemic control. Patient verbalized understanding of information discussed and he states that he has no further questions at this time related to diabetes.   Thanks, Barnie Alderman, RN, MSN, CCRN, CDE Diabetes Coordinator Inpatient Diabetes Program [phone] (Team Pager) [phone] ([place] office) [phone] [facility] office)

## 2014-11-12 NOTE — Progress Notes (Signed)
SBIRT completed.  Pt denies using drugs or other substances.  No further intervention necessary.
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
## 2014-11-12 NOTE — Progress Notes (Addendum)
  Pt request for a new endocrinologist as he states he was unable to get his insulin refilled.  States his previous MD (would not get it called in) and wants to find a new doctor. Pt did have a pencil to write down thus request MD here give him these possible endo's to follow. Talked with patient in his room and discussed his diabetes care. It appears (he states) it is truly frustrating to call his MD for insulin refills and back to the pharmacist and gets no communication and has run out of it in the past causing DKA (according to Pt) Thank you Rosita Kea, RN, MSN, CDE    Diabetes Inpatient Program Office: [phone] Pager: [phone] 8:00 am to 5:00 pm

## 2014-11-12 NOTE — Progress Notes (Signed)
Inpatient Diabetes Program Recommendations  AACE/ADA: New Consensus Statement on Inpatient Glycemic Control (2013)  Target Ranges:  Prepandial:   less than 140 mg/dL      Peak postprandial:   less than 180 mg/dL (1-2 hours)      Critically ill patients:  140 - 180 mg/dL   Diabetes history: DM1 Outpatient Diabetes medications: Levemir 28 units QHS, Novolog 8-14 units TID with meals plus sliding scale correction Current orders for Inpatient glycemic control: Levemir 30 units QHS, Novolog 0-15 units Q4H  Inpatient Diabetes Program Recommendations Correction (SSI): Glucose 52 mg/dl at 4:07 am. Anticipate hypoglycemia due to Novolog correction insulin. Patient received Novolog 5 units at 00:09 for CBG of 219 mg/dl. Patient has Type 1 diabetes and is sensitive to insulin. Please decrease Novolog correction scale to sensitive and change frequency to ACHS. Insulin - Meal Coverage: Patient has Type 1 diabetes and requires insulin coverage for carbohydrates consumed. Please consider ordering Novolog 4 units TID with meals for meal coverage (in addition to Novolog sensitive correction scale). Diet: Please change diet from Regular to Carb Modified diet.  Thanks, Barnie Alderman, RN, MSN, CCRN, CDE Diabetes Coordinator Inpatient Diabetes Program [phone] (Team Pager from [place] to [place]) [phone] ([place] office) [phone] [facility] office)

## 2014-11-13 ENCOUNTER — Inpatient Hospital Stay (HOSPITAL_COMMUNITY): Payer: BLUE CROSS/BLUE SHIELD

## 2014-11-13 LAB — GLUCOSE, CAPILLARY
Glucose-Capillary: 194 mg/dL — ABNORMAL HIGH (ref 70–99)
Glucose-Capillary: 184 mg/dL — ABNORMAL HIGH (ref 70–99)
Glucose-Capillary: 147 mg/dL — ABNORMAL HIGH (ref 70–99)
Glucose-Capillary: 374 mg/dL — ABNORMAL HIGH (ref 70–99)

## 2014-11-13 MED ORDER — DIAZEPAM 5 MG PO TABS
5.0000 mg | ORAL_TABLET | Freq: Once | ORAL | Status: AC
  Administered 2014-11-13: 5 mg via ORAL
  Filled 2014-11-13: qty 1

## 2014-11-13 MED ORDER — KETOROLAC TROMETHAMINE 30 MG/ML IJ SOLN
30.0000 mg | Freq: Once | INTRAMUSCULAR | Status: AC
  Administered 2014-11-13: 30 mg via INTRAVENOUS
  Filled 2014-11-13: qty 1
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
## 2014-11-13 NOTE — Progress Notes (Signed)
Inpatient Diabetes Program Recommendations  AACE/ADA: New Consensus Statement on Inpatient Glycemic Control (2013)  Target Ranges:  Prepandial:   less than 140 mg/dL      Peak postprandial:   less than 180 mg/dL (1-2 hours)      Critically ill patients:  140 - 180 mg/dL   Results for [name], [name] (MRN [id number]) as of 11/13/2014 09:25  Ref. Range 11/12/2014 08:48 11/12/2014 11:46 11/12/2014 15:59 11/12/2014 21:59 11/13/2014 08:11  Glucose-Capillary Latest Range: 70-99 mg/dL 132 (H) 270 (H) 253 (H) 373 (H) 194 (H)   Diabetes history: DM1 Outpatient Diabetes medications: Levemir 28 units QHS, Novolog 14 units TID with meals Current orders for Inpatient glycemic control: Levemir 30 units QHS, Novolog 0-9 units TID with  Meals, Novolog 0-5 units HS, Novolog 4 units TID with meals for meal coverage  Inpatient Diabetes Program Recommendations Insulin - Meal Coverage: Post prandial glucose is consistently elevated. Please consider increasing meal coverage to Novolog 8 units TID with meals.  Thanks, Barnie Alderman, RN, MSN, CCRN, CDE Diabetes Coordinator Inpatient Diabetes Program [phone] (Team Pager from [place] to [place]) [phone] ([place] office) [phone] [facility] office)

## 2014-11-13 NOTE — Progress Notes (Signed)
OT Cancellation Note  Patient Details Name: [name] MRN: [id number] DOB: 02/08/1986   Cancelled Treatment:    Reason Eval/Treat Not Completed: Pain limiting ability to participate;Other (comment). Pt states that he has 7/10 R ankle pain and that is he is very aggravated due to itching he is having and requested OT return later. Will re attempt later today as time allows/as appropriate  Britt Bottom 11/13/2014, 11:11 AM
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
## 2014-11-14 LAB — GLUCOSE, CAPILLARY
Glucose-Capillary: 87 mg/dL (ref 70–99)
Glucose-Capillary: 273 mg/dL — ABNORMAL HIGH (ref 70–99)
Glucose-Capillary: 126 mg/dL — ABNORMAL HIGH (ref 70–99)
Glucose-Capillary: 214 mg/dL — ABNORMAL HIGH (ref 70–99)
Glucose-Capillary: 377 mg/dL — ABNORMAL HIGH (ref 70–99)
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
## 2014-11-14 NOTE — Anesthesia Postprocedure Evaluation (Signed)
  Anesthesia Post-op Note  Patient: [name]  Procedure(s) Performed: Procedure(s) (LRB): IRRIGATION AND DEBRIDEMENT EXTREMITY POSSIBLE WOUND VAC PLACEMENT (Right)  Patient Location: PACU  Anesthesia Type: General  Level of Consciousness: awake and alert   Airway and Oxygen Therapy: Patient Spontanous Breathing  Post-op Pain: mild  Post-op Assessment: Post-op Vital signs reviewed, Patient's Cardiovascular Status Stable, Respiratory Function Stable, Patent Airway and No signs of Nausea or vomiting  Last Vitals:  Filed Vitals:   11/14/14 1300  BP: 129/74  Pulse: 83  Temp: 36.9 C  Resp: 16    Post-op Vital Signs: stable   Complications: No apparent anesthesia complications

## 2014-11-14 NOTE — Progress Notes (Signed)
Patient ID: [name], male   DOB: September 20, 1985, 29 y.o.   MRN: [id number]   LOS: 6 days   Subjective: Feeling better today.   Objective: Vital signs in last 24 hours: Temp:  [97.4 F (36.3 C)-98.2 F (36.8 C)] 97.4 F (36.3 C) (04/08 0446) Pulse Rate:  [76-88] 76 (04/08 0446) Resp:  [16-18] 16 (04/08 0446) BP: (108-133)/(75-80) 122/77 mmHg (04/08 0446) SpO2:  [96 %-97 %] 96 % (04/08 0446) Last BM Date: 11/07/14   Laboratory Results CBG (last 3)   Recent Labs  11/13/14 1621 11/13/14 2146 11/14/14 0642  GLUCAP 147* 374* 87    Physical Exam General appearance: alert and no distress Resp: clear to auscultation bilaterally Cardio: regular rate and rhythm GI: normal findings: bowel sounds normal and soft, non-tender Extremities: NVI   Assessment/Plan: [facility] Right anterior column acetabular fracture--Non-op, TDWB to RLE, anterior hip precautions.  Right fourth MCP fracture s/p CR, pinning -- NWB Avulsion injury right knee and proximal shin s/p I&D, VAC placement -- per Dr. Percell Miller. DC with wound VAC f/u with Dr. Migdalia Dk in 1 week Ehlers-Danlos disease  Type I diabetes mellitus- Appreciate diabetes nurse assistance.  VTE - SCD's, heparin FEN - no issues Dispo -- stable for DC, but needs VAC approved. Supervision(mom), 3:1, R platform RW.     [name], PA-C Pager: [phone] General Trauma PA Pager: [phone]  11/14/2014

## 2014-11-14 NOTE — Progress Notes (Signed)
Subjective: 3 Days Post-Op Procedure(s) (LRB): IRRIGATION AND DEBRIDEMENT EXTREMITY POSSIBLE WOUND VAC PLACEMENT (Right) Patient reports pain as controlled.    Objective: Vital signs in last 24 hours: Temp:  [97.4 F (36.3 C)-98.5 F (36.9 C)] 98.5 F (36.9 C) (04/08 1300) Pulse Rate:  [76-88] 83 (04/08 1300) Resp:  [16] 16 (04/08 1300) BP: (108-129)/(74-77) 129/74 mmHg (04/08 1300) SpO2:  [96 %-97 %] 96 % (04/08 1300)  Intake/Output from previous day: 04/07 0701 - 04/08 0700 In: 640 [P.O.:640] Out: 2800 [Urine:2800] Intake/Output this shift: Total I/O In: 120 [P.O.:120] Out: -   No results for input(s): HGB in the last 72 hours. No results for input(s): WBC, RBC, HCT, PLT in the last 72 hours. No results for input(s): NA, K, CL, CO2, BUN, CREATININE, GLUCOSE, CALCIUM in the last 72 hours. No results for input(s): LABPT, INR in the last 72 hours.  intact sensation and capillary refill all digits.  +epl/fpl.  splint clean/dry/intact.  Assessment/Plan: S/p CRPP right ring finger metacarpal fracture.  Follow up in office next week.  Cleaster Shiffer R 11/14/2014, 1:57 PM

## 2014-11-14 NOTE — Progress Notes (Signed)
PT Cancellation Note  Patient Details Name: QUINTELL BONNIN MRN: [id number] DOB: 08-Jun-1986   Cancelled Treatment:    Reason Eval/Treat Not Completed: Fatigue/lethargy limiting ability to participate. Patient heavily sleeping when entering room. Family present. Agreeable to hold session til Monday as patient has been up and moving well with therapies and nursing staff. Recommend up over weekend with staff. Patient will be inpatient until Monday due to [facility] dressing. Will follow up.    Jacqualyn Posey 11/14/2014, 2:46 PM
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
## 2014-11-15 LAB — GLUCOSE, CAPILLARY
Glucose-Capillary: 184 mg/dL — ABNORMAL HIGH (ref 70–99)
Glucose-Capillary: 303 mg/dL — ABNORMAL HIGH (ref 70–99)

## 2014-11-15 MED ORDER — SENNOSIDES-DOCUSATE SODIUM 8.6-50 MG PO TABS
2.0000 | ORAL_TABLET | Freq: Two times a day (BID) | ORAL | Status: DC
  Administered 2014-11-15 – 2014-11-16 (×3): 2 via ORAL
  Filled 2014-11-15 (×3): qty 2

## 2014-11-15 MED ORDER — DIPHENHYDRAMINE HCL 25 MG PO CAPS
25.0000 mg | ORAL_CAPSULE | Freq: Four times a day (QID) | ORAL | Status: DC | PRN
  Administered 2014-11-15: 25 mg via ORAL
  Filled 2014-11-15: qty 1
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
## 2014-11-16 LAB — GLUCOSE, CAPILLARY
Glucose-Capillary: 436 mg/dL — ABNORMAL HIGH (ref 70–99)
Glucose-Capillary: 252 mg/dL — ABNORMAL HIGH (ref 70–99)
Glucose-Capillary: 279 mg/dL — ABNORMAL HIGH (ref 70–99)
Glucose-Capillary: 167 mg/dL — ABNORMAL HIGH (ref 70–99)
Glucose-Capillary: 103 mg/dL — ABNORMAL HIGH (ref 70–99)

## 2014-11-16 MED ORDER — DOCUSATE SODIUM 100 MG PO CAPS
200.0000 mg | ORAL_CAPSULE | Freq: Every day | ORAL | Status: DC
  Administered 2014-11-16 – 2014-11-17 (×2): 200 mg via ORAL
  Filled 2014-11-16 (×2): qty 2

## 2014-11-16 MED ORDER — BISACODYL 5 MG PO TBEC
10.0000 mg | DELAYED_RELEASE_TABLET | Freq: Every day | ORAL | Status: DC | PRN
  Administered 2014-11-16: 10 mg via ORAL
  Filled 2014-11-16: qty 2
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
## 2014-11-16 NOTE — Progress Notes (Signed)
5 Days Post-Op  Subjective: He feels OK, no Bm, still unable to work out logistics of post op care/wound vac issue.  Objective: Vital signs in last 24 hours: Temp:  [97.7 F (36.5 C)-98.1 F (36.7 C)] 97.9 F (36.6 C) (04/10 0704) Pulse Rate:  [78-108] 78 (04/10 0704) Resp:  [16-18] 16 (04/10 0704) BP: (115-126)/(78-81) 115/78 mmHg (04/10 0704) SpO2:  [96 %-97 %] 97 % (04/10 0704) Last BM Date: 11/07/14 560 Po Carb Modified diet Afebrile, VSS Glucose still up will get diabetic coordinator involved recheck labs in AM Intake/Output from previous day: 04/09 0701 - 04/10 0700 In: 560 [P.O.:560] Out: 2820 [Urine:2820] Intake/Output this shift:    General appearance: alert, cooperative and no distress Resp: clear to auscultation bilaterally GI: soft, non-tender; bowel sounds normal; no masses,  no organomegaly Extremities: Imobolizer and splints in place.  wound vac in place.  Lab Results:  No results for input(s): WBC, HGB, HCT, PLT in the last 72 hours.  BMET No results for input(s): NA, K, CL, CO2, GLUCOSE, BUN, CREATININE, CALCIUM in the last 72 hours. PT/INR No results for input(s): LABPROT, INR in the last 72 hours.  No results for input(s): AST, ALT, ALKPHOS, BILITOT, PROT, ALBUMIN in the last 168 hours.   Lipase     Component Value Date/Time   LIPASE 17 11/26/2007 0150     Studies/Results: No results found.  Medications: .  ceFAZolin (ANCEF) IV  2 g Intravenous 3 times per day  . heparin subcutaneous  5,000 Units Subcutaneous 3 times per day  . insulin aspart  0-5 Units Subcutaneous QHS  . insulin aspart  0-9 Units Subcutaneous TID WC  . insulin aspart  4 Units Subcutaneous TID WC  . insulin detemir  30 Units Subcutaneous QHS  . polyethylene glycol  17 g Oral Daily  . senna-docusate  2 tablet Oral BID    Assessment/Plan [facility] Right anterior column acetabular fracture--Non-op, TDWB to RLE, anterior hip precautions.  Right fourth MCP fracture s/p CR,  pinning -- NWB Avulsion injury right knee and proximal shin s/p I&D, VAC placement -- per Dr. Percell Miller. DC with wound VAC f/u with Dr. Migdalia Dk in 1 week Ehlers-Danlos disease  Type I diabetes mellitus- Appreciate diabetes nurse assistance. He just takes his insulin but never really checks his glucose at home. VTE - SCD's, heparin FEN - no issues Day 9 Cefazolin - How much longer does he need to be on this and does he need home antibioticsl   Dispo -- stable for DC, but needs VAC approved, this is apparently a very large issue. Supervision(mom), 3:1, R platform RW.   Plan:  Wound vac is still an Issue.  Call Dr. Percell Miller on Monday and see if he wants to change, or continue wound vac.  Work out issues with care.  Recheck labs and Hemoglobin A1C tomorrow.       LOS: 8 days    [name] 11/16/2014

## 2014-11-17 ENCOUNTER — Encounter: Payer: Self-pay | Admitting: Orthopedic Surgery

## 2014-11-17 LAB — CBC
HCT: 40.3 % (ref 39.0–52.0)
Hemoglobin: 13.8 g/dL (ref 13.0–17.0)
MCH: 32.2 pg (ref 26.0–34.0)
MCHC: 34.2 g/dL (ref 30.0–36.0)
MCV: 94.2 fL (ref 78.0–100.0)
Platelets: 299 10*3/uL (ref 150–400)
RBC: 4.28 MIL/uL (ref 4.22–5.81)
RDW: 12.5 % (ref 11.5–15.5)
WBC: 5.2 10*3/uL (ref 4.0–10.5)

## 2014-11-17 LAB — GLUCOSE, CAPILLARY
Glucose-Capillary: 127 mg/dL — ABNORMAL HIGH (ref 70–99)
Glucose-Capillary: 145 mg/dL — ABNORMAL HIGH (ref 70–99)
Glucose-Capillary: 190 mg/dL — ABNORMAL HIGH (ref 70–99)
Glucose-Capillary: 348 mg/dL — ABNORMAL HIGH (ref 70–99)

## 2014-11-17 LAB — COMPREHENSIVE METABOLIC PANEL
ALT: 12 U/L (ref 0–53)
AST: 35 U/L (ref 0–37)
Albumin: 3.2 g/dL — ABNORMAL LOW (ref 3.5–5.2)
Alkaline Phosphatase: 105 U/L (ref 39–117)
Anion gap: 5 (ref 5–15)
BUN: 29 mg/dL — ABNORMAL HIGH (ref 6–23)
CO2: 28 mmol/L (ref 19–32)
Calcium: 9.2 mg/dL (ref 8.4–10.5)
Chloride: 104 mmol/L (ref 96–112)
Creatinine, Ser: 0.85 mg/dL (ref 0.50–1.35)
GFR calc Af Amer: >90 mL/min (ref >90)
GFR calc non Af Amer: >90 mL/min (ref >90)
Glucose, Bld: 134 mg/dL — ABNORMAL HIGH (ref 70–99)
Potassium: 3.7 mmol/L (ref 3.5–5.1)
Sodium: 137 mmol/L (ref 135–145)
Total Bilirubin: 0.5 mg/dL (ref 0.3–1.2)
Total Protein: 6.7 g/dL (ref 6.0–8.3)

## 2014-11-17 LAB — HEMOGLOBIN A1C
Hgb A1c MFr Bld: 9.2 % — ABNORMAL HIGH (ref 4.8–5.6)
Mean Plasma Glucose: 217 mg/dL

## 2014-11-17 MED ORDER — OXYCODONE-ACETAMINOPHEN 5-325 MG PO TABS: 1.0000 | ORAL_TABLET | ORAL | Status: AC | PRN
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
## 2014-11-17 NOTE — Progress Notes (Signed)
[name] discharged home per MD order. Discharge instructions reviewed and discussed with patient. All questions and concerns answered. Copy of instructions and scripts given to patient. IV removed. Drain on RLE intact.  Patient escorted to car by staff in a wheelchair. No distress noted upon discharge.   Esaw Dace 11/17/2014 6:33 PM

## 2014-11-17 NOTE — Progress Notes (Signed)
Patient ID: [name], male   DOB: Dec 27, 1985, 29 y.o.   MRN: [id number]   LOS: 9 days   Subjective: No changes, ready to go home.   Objective: Vital signs in last 24 hours: Temp:  [97.7 F (36.5 C)-98.6 F (37 C)] 98.1 F (36.7 C) (04/11 0523) Pulse Rate:  [86-87] 86 (04/11 0523) Resp:  [16] 16 (04/10 1259) BP: (122-129)/(74-82) 126/78 mmHg (04/11 0523) SpO2:  [98 %] 98 % (04/11 0523) Last BM Date: 11/07/14   Laboratory  CBC  Recent Labs  11/17/14 0634  WBC 5.2  HGB 13.8  HCT 40.3  PLT 299   BMET  Recent Labs  11/17/14 0634  NA 137  K 3.7  CL 104  CO2 28  GLUCOSE 134*  BUN 29*  CREATININE 0.85  CALCIUM 9.2   CBG (last 3)   Recent Labs  11/16/14 1625 11/16/14 2116 11/17/14 0627  GLUCAP 103* 127* 145*    Physical Exam General appearance: alert and no distress Resp: clear to auscultation bilaterally Cardio: regular rate and rhythm GI: normal findings: bowel sounds normal and soft, non-tender   Assessment/Plan: [facility] Right anterior column acetabular fracture--Non-op, TDWB to RLE, anterior hip precautions.  Right fourth MCP fracture s/p CR, pinning -- NWB Avulsion injury right knee and proximal shin s/p I&D, VAC placement -- per Dr. Percell Miller. DC wound VAC f/u with Dr. Migdalia Dk in 1 week Ehlers-Danlos disease  Type I diabetes mellitus- Appreciate diabetes nurse assistance. He just takes his insulin but never really checks his glucose at home. FEN - no issues VTE - SCD's, heparin Dispo -- D/C today    [name], PA-C Pager: [phone] General Trauma PA Pager: [phone]  11/17/2014

## 2014-11-17 NOTE — Progress Notes (Signed)
DME called in to [facility] for delivery to pt's room. No HH needs.  Sandi Mariscal, RN BSN MHA CCM  Case Manager, Trauma Service/Unit 62M [phone]
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
## 2014-11-19 NOTE — Progress Notes (Addendum)
He had a surgical procedure on both 4/2 and 4/5 that included excisional debridement of foreign material and skin/fascia with scissors and scalpel    [name] D

## 2014-12-01 ENCOUNTER — Encounter (HOSPITAL_BASED_OUTPATIENT_CLINIC_OR_DEPARTMENT_OTHER): Payer: BLUE CROSS/BLUE SHIELD | Attending: Plastic Surgery

## 2014-12-01 DIAGNOSIS — Z794 Long term (current) use of insulin: Secondary | ICD-10-CM | POA: Insufficient documentation

## 2014-12-01 DIAGNOSIS — S81801A Unspecified open wound, right lower leg, initial encounter: Secondary | ICD-10-CM | POA: Diagnosis present

## 2014-12-01 DIAGNOSIS — L97211 Non-pressure chronic ulcer of right calf limited to breakdown of skin: Secondary | ICD-10-CM | POA: Diagnosis not present

## 2014-12-01 DIAGNOSIS — E1065 Type 1 diabetes mellitus with hyperglycemia: Secondary | ICD-10-CM | POA: Diagnosis not present

## 2014-12-01 LAB — GLUCOSE, CAPILLARY: Glucose-Capillary: 430 mg/dL — ABNORMAL HIGH (ref 70–99)

## 2014-12-08 ENCOUNTER — Encounter (HOSPITAL_BASED_OUTPATIENT_CLINIC_OR_DEPARTMENT_OTHER): Payer: No Typology Code available for payment source

## 2015-01-02 ENCOUNTER — Encounter: Payer: BLUE CROSS/BLUE SHIELD | Attending: Internal Medicine | Admitting: *Deleted

## 2015-01-02 ENCOUNTER — Encounter: Payer: Self-pay | Admitting: *Deleted

## 2015-01-02 VITALS — Ht 69.0 in | Wt 160.2 lb

## 2015-01-02 DIAGNOSIS — Z713 Dietary counseling and surveillance: Secondary | ICD-10-CM | POA: Insufficient documentation

## 2015-01-02 DIAGNOSIS — E1065 Type 1 diabetes mellitus with hyperglycemia: Secondary | ICD-10-CM | POA: Diagnosis present

## 2015-01-02 DIAGNOSIS — Z794 Long term (current) use of insulin: Secondary | ICD-10-CM | POA: Insufficient documentation

## 2015-01-02 DIAGNOSIS — IMO0002 Reserved for concepts with insufficient information to code with codable children: Secondary | ICD-10-CM

## 2015-01-02 NOTE — Progress Notes (Signed)
Professional Dexcom Set Up on 01/02/15 Time arrived:1115  Time left: 5797 Patient here for placement of Dexcom Continuous Glucose Monitor   Explained to patient purpose of wearing the Dexcom per MD orders. They expressed verbal understanding.  Sensor inserted into skin at least 2 inches from any insulin injection sites. Patient instructed to check BG 2 times each day to calibrate. Explained to patient how to input onto [facility] all BG, food intake and any diabetes medications.  Patient instructed to return on Monday, 01/12/15 for removal of sensor and Dexcom  Dexcom transmitter attached to sensor and taped down.  Patient informed that when Dexcom and sensor are connected, the system is waterproof and that they are to wear it consistently until they return to this office.  Patient to call this office if any questions or concerns prior to appointment to return the [facility] for downloading.

## 2015-01-26 ENCOUNTER — Other Ambulatory Visit: Payer: Self-pay | Admitting: Orthopedic Surgery

## 2015-01-26 DIAGNOSIS — S62334A Displaced fracture of neck of fourth metacarpal bone, right hand, initial encounter for closed fracture: Secondary | ICD-10-CM

## 2015-01-28 ENCOUNTER — Ambulatory Visit
Admission: RE | Admit: 2015-01-28 | Discharge: 2015-01-28 | Disposition: A | Discharge status: Home / Self Care | Payer: BLUE CROSS/BLUE SHIELD | Source: Ambulatory Visit | Attending: Orthopedic Surgery | Admitting: Orthopedic Surgery

## 2015-01-28 DIAGNOSIS — S62334A Displaced fracture of neck of fourth metacarpal bone, right hand, initial encounter for closed fracture: Secondary | ICD-10-CM

## 2015-01-28 MED ORDER — IOPAMIDOL (ISOVUE-300) INJECTION 61%: 125.0000 mL | Freq: Once | INTRAVENOUS | Status: DC | PRN

## 2016-12-30 ENCOUNTER — Other Ambulatory Visit: Payer: Self-pay | Admitting: Endocrinology

## 2016-12-30 DIAGNOSIS — E221 Hyperprolactinemia: Secondary | ICD-10-CM

## 2017-09-28 ENCOUNTER — Other Ambulatory Visit: Payer: Self-pay

## 2017-09-28 ENCOUNTER — Encounter (HOSPITAL_COMMUNITY): Payer: Self-pay

## 2017-09-28 ENCOUNTER — Emergency Department (HOSPITAL_COMMUNITY)
Admission: EM | Admit: 2017-09-28 | Discharge: 2017-09-29 | Disposition: A | Discharge status: Home / Self Care | Payer: BLUE CROSS/BLUE SHIELD | Attending: Emergency Medicine | Admitting: Emergency Medicine

## 2017-09-28 DIAGNOSIS — R55 Syncope and collapse: Secondary | ICD-10-CM | POA: Diagnosis not present

## 2017-09-28 DIAGNOSIS — E162 Hypoglycemia, unspecified: Secondary | ICD-10-CM

## 2017-09-28 DIAGNOSIS — Z794 Long term (current) use of insulin: Secondary | ICD-10-CM | POA: Diagnosis not present

## 2017-09-28 DIAGNOSIS — E11649 Type 2 diabetes mellitus with hypoglycemia without coma: Secondary | ICD-10-CM | POA: Insufficient documentation

## 2017-09-28 LAB — CBG MONITORING, ED
Glucose-Capillary: 80 mg/dL (ref 65–99)
Glucose-Capillary: 192 mg/dL — ABNORMAL HIGH (ref 65–99)

## 2017-09-28 NOTE — ED Triage Notes (Signed)
Patient was at work when he lost consciousness. States that he thinks that he took too much insulin and did not eat enough. Alert and oriented x4.
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
## 2017-09-29 NOTE — Discharge Instructions (Signed)
1. Medications: usual home medications 2. Treatment: rest, drink plenty of fluids, eat when you get home 3. Follow Up: Please followup with your primary doctor in 2-3 days for discussion of your diagnoses and further evaluation after today's visit; if you do not have a primary care doctor use the resource guide provided to find one; Please return to the ER for any additional episodes of low blood sugar, fevers, vomiting or other concerns

## 2017-10-15 ENCOUNTER — Emergency Department (HOSPITAL_COMMUNITY)
Admission: EM | Admit: 2017-10-15 | Discharge: 2017-10-16 | Disposition: A | Discharge status: Home / Self Care | Payer: BLUE CROSS/BLUE SHIELD | Attending: Emergency Medicine | Admitting: Emergency Medicine

## 2017-10-15 ENCOUNTER — Emergency Department (HOSPITAL_COMMUNITY): Payer: BLUE CROSS/BLUE SHIELD

## 2017-10-15 DIAGNOSIS — Z794 Long term (current) use of insulin: Secondary | ICD-10-CM | POA: Diagnosis not present

## 2017-10-15 DIAGNOSIS — F32A Depression, unspecified: Secondary | ICD-10-CM

## 2017-10-15 DIAGNOSIS — Q249 Congenital malformation of heart, unspecified: Secondary | ICD-10-CM | POA: Insufficient documentation

## 2017-10-15 DIAGNOSIS — F329 Major depressive disorder, single episode, unspecified: Secondary | ICD-10-CM | POA: Diagnosis not present

## 2017-10-15 DIAGNOSIS — E11649 Type 2 diabetes mellitus with hypoglycemia without coma: Secondary | ICD-10-CM | POA: Diagnosis not present

## 2017-10-15 DIAGNOSIS — Z046 Encounter for general psychiatric examination, requested by authority: Secondary | ICD-10-CM | POA: Diagnosis not present

## 2017-10-15 DIAGNOSIS — R4182 Altered mental status, unspecified: Secondary | ICD-10-CM | POA: Diagnosis not present

## 2017-10-15 DIAGNOSIS — R454 Irritability and anger: Secondary | ICD-10-CM

## 2017-10-15 DIAGNOSIS — Z5329 Procedure and treatment not carried out because of patient's decision for other reasons: Secondary | ICD-10-CM | POA: Diagnosis not present

## 2017-10-15 DIAGNOSIS — R410 Disorientation, unspecified: Secondary | ICD-10-CM | POA: Diagnosis present

## 2017-10-15 DIAGNOSIS — E162 Hypoglycemia, unspecified: Secondary | ICD-10-CM

## 2017-10-15 LAB — RAPID URINE DRUG SCREEN, HOSP PERFORMED
Amphetamines: NOT DETECTED
Barbiturates: NOT DETECTED
Benzodiazepines: NOT DETECTED
Cocaine: NOT DETECTED
Opiates: NOT DETECTED
Tetrahydrocannabinol: NOT DETECTED

## 2017-10-15 LAB — URINALYSIS, ROUTINE W REFLEX MICROSCOPIC
Bacteria, UA: NONE SEEN
Bilirubin Urine: NEGATIVE
Glucose, UA: >=500 mg/dL — AB
Hgb urine dipstick: NEGATIVE
Ketones, ur: 20 mg/dL — AB
Leukocytes, UA: NEGATIVE
Nitrite: NEGATIVE
Protein, ur: NEGATIVE mg/dL
Specific Gravity, Urine: 1.031 — ABNORMAL HIGH (ref 1.005–1.030)
pH: 6 (ref 5.0–8.0)

## 2017-10-15 LAB — COMPREHENSIVE METABOLIC PANEL
ALT: 21 U/L (ref 17–63)
AST: 29 U/L (ref 15–41)
Albumin: 4.1 g/dL (ref 3.5–5.0)
Alkaline Phosphatase: 71 U/L (ref 38–126)
Anion gap: 7 (ref 5–15)
BUN: 23 mg/dL — ABNORMAL HIGH (ref 6–20)
CO2: 24 mmol/L (ref 22–32)
Calcium: 8.4 mg/dL — ABNORMAL LOW (ref 8.9–10.3)
Chloride: 104 mmol/L (ref 101–111)
Creatinine, Ser: 1 mg/dL (ref 0.61–1.24)
GFR calc Af Amer: >60 mL/min (ref >60)
GFR calc non Af Amer: >60 mL/min (ref >60)
Glucose, Bld: 179 mg/dL — ABNORMAL HIGH (ref 65–99)
Potassium: 4.5 mmol/L (ref 3.5–5.1)
Sodium: 135 mmol/L (ref 135–145)
Total Bilirubin: 0.7 mg/dL (ref 0.3–1.2)
Total Protein: 7 g/dL (ref 6.5–8.1)

## 2017-10-15 LAB — CBC WITH DIFFERENTIAL/PLATELET
Basophils Absolute: 0 10*3/uL (ref 0.0–0.1)
Basophils Relative: 0 %
Eosinophils Absolute: 0 10*3/uL (ref 0.0–0.7)
Eosinophils Relative: 1 %
HCT: 45.2 % (ref 39.0–52.0)
Hemoglobin: 16 g/dL (ref 13.0–17.0)
Lymphocytes Relative: 12 %
Lymphs Abs: 0.8 10*3/uL (ref 0.7–4.0)
MCH: 32.3 pg (ref 26.0–34.0)
MCHC: 35.4 g/dL (ref 30.0–36.0)
MCV: 91.3 fL (ref 78.0–100.0)
Monocytes Absolute: 0.3 10*3/uL (ref 0.1–1.0)
Monocytes Relative: 5 %
Neutro Abs: 5.5 10*3/uL (ref 1.7–7.7)
Neutrophils Relative %: 82 %
Platelets: 172 10*3/uL (ref 150–400)
RBC: 4.95 MIL/uL (ref 4.22–5.81)
RDW: 12.2 % (ref 11.5–15.5)
WBC: 6.6 10*3/uL (ref 4.0–10.5)

## 2017-10-15 LAB — CBG MONITORING, ED
Glucose-Capillary: 179 mg/dL — ABNORMAL HIGH (ref 65–99)
Glucose-Capillary: 126 mg/dL — ABNORMAL HIGH (ref 65–99)

## 2017-10-15 LAB — ETHANOL: Alcohol, Ethyl (B): <10 mg/dL (ref <10)

## 2017-10-15 MED ORDER — INSULIN ASPART 100 UNIT/ML ~~LOC~~ SOLN: 0.0000 [IU] | Freq: Three times a day (TID) | SUBCUTANEOUS | Status: DC

## 2017-10-15 MED ORDER — SODIUM CHLORIDE 0.9 % IV BOLUS (SEPSIS)
1000.0000 mL | Freq: Once | INTRAVENOUS | Status: AC
  Administered 2017-10-15: 1000 mL via INTRAVENOUS

## 2017-10-15 MED ORDER — ONDANSETRON HCL 4 MG/2ML IJ SOLN
4.0000 mg | Freq: Once | INTRAMUSCULAR | Status: AC
  Administered 2017-10-15: 4 mg via INTRAVENOUS
  Filled 2017-10-15: qty 2

## 2017-10-15 NOTE — ED Notes (Signed)
Pt given a ham sandwich
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
## 2017-10-15 NOTE — ED Notes (Signed)
Bed: SW96 Expected date:  Expected time:  Means of arrival:  Comments: Unresponsive, hypoglycemia

## 2017-10-15 NOTE — ED Notes (Signed)
Pt too lethargic to eat/drink at this time
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
## 2017-10-15 NOTE — ED Notes (Signed)
Pt was asked to drink to produce urine sample, pt responded but did not want to be woken up.

## 2017-10-16 LAB — CBG MONITORING, ED: Glucose-Capillary: 126 mg/dL — ABNORMAL HIGH (ref 65–99)

## 2017-10-16 NOTE — ED Notes (Signed)
Pt. IV 2 saline locks removed per RN,Lisa. Saline IV locks intact.

## 2017-10-16 NOTE — ED Notes (Signed)
Pt seen leaving with family  EDP,  [place] notified and TTS notified

## 2017-10-16 NOTE — Progress Notes (Signed)
TTS received a phone call from [place], [place] who states the pt eloped.  Lind Covert, MSW, LCSW Therapeutic Triage Specialist  [phone]

## 2017-10-16 NOTE — ED Notes (Addendum)
Pt. Seen leaving out of the room with mother by this NT. Pt. Was asked 2 times to get into paper scrubs but refused. RN,Lisa made aware and EDP,[place],MD.

## 2017-11-22 ENCOUNTER — Ambulatory Visit
Admission: RE | Admit: 2017-11-22 | Discharge: 2017-11-22 | Disposition: A | Discharge status: Home / Self Care | Payer: BLUE CROSS/BLUE SHIELD | Source: Ambulatory Visit | Attending: Endocrinology | Admitting: Endocrinology

## 2017-11-22 DIAGNOSIS — E221 Hyperprolactinemia: Secondary | ICD-10-CM

## 2017-11-22 MED ORDER — GADOBENATE DIMEGLUMINE 529 MG/ML IV SOLN
10.0000 mL | Freq: Once | INTRAVENOUS | Status: AC | PRN
  Administered 2017-11-22: 10 mL via INTRAVENOUS

## 2017-12-05 ENCOUNTER — Other Ambulatory Visit: Payer: Self-pay

## 2017-12-05 ENCOUNTER — Ambulatory Visit: Payer: BLUE CROSS/BLUE SHIELD | Admitting: Neurology

## 2017-12-05 ENCOUNTER — Encounter: Payer: Self-pay | Admitting: Neurology

## 2017-12-05 ENCOUNTER — Telehealth: Payer: Self-pay | Admitting: Neurology

## 2017-12-05 VITALS — BP 123/75 | HR 80 | Resp 14 | Ht 67.0 in | Wt 173.0 lb

## 2017-12-05 DIAGNOSIS — R930 Abnormal findings on diagnostic imaging of skull and head, not elsewhere classified: Secondary | ICD-10-CM

## 2017-12-05 DIAGNOSIS — R350 Frequency of micturition: Secondary | ICD-10-CM

## 2017-12-05 DIAGNOSIS — E108 Type 1 diabetes mellitus with unspecified complications: Secondary | ICD-10-CM

## 2017-12-05 DIAGNOSIS — Q796 Ehlers-Danlos syndrome, unspecified: Secondary | ICD-10-CM

## 2017-12-05 DIAGNOSIS — N529 Male erectile dysfunction, unspecified: Secondary | ICD-10-CM

## 2017-12-05 DIAGNOSIS — Z8619 Personal history of other infectious and parasitic diseases: Secondary | ICD-10-CM

## 2017-12-05 NOTE — Telephone Encounter (Signed)
BCBS Auth: NPR Ref # [id number] order sent to [facility]. They will reach out to the pt to schedule.

## 2017-12-05 NOTE — Progress Notes (Signed)
[facility]  PATIENT: [name] DOB: 03/05/1986  REFERRING DOCTOR OR PCP:  Dr. Chalmers Cater.    PCP is Dr. Arelia Sneddon. SOURCE: patient, notes from Dr. Chalmers Cater, lab and imaging reports, MRI images on PACS personally reviewed  _________________________________   HISTORICAL  CHIEF COMPLAINT:  Chief Complaint  Patient presents with  . Abnormal MRI Brain    Here with his girlfriend and mother to discuss MRI brain, r/o MS.  Sts. MRI was ordered to investigate ED sx--pituitary gland.  Hx. of Type 1 diabetes, and Ehlers-Danlos Syndrome/fim    HISTORY OF PRESENT ILLNESS:  I had the pleasure of seeing your patient, [name], at [facility] for neurologic consultation regarding his abnormal MRI and concern for multiple sclerosis.   He is a 32 year old man with type I insulin-dependent diabetes mellitus since age 18 and Ehlers-Danlos syndrome.Type 1.     He was undergoing evaluation for erectile dysfunction and was found to have an elevated prolactin of 18-22 (normal <15.2).   Marland Kitchen  An MRI of the brain with thin sections through the pituitary gland was ordered for further evaluation.   I personally reviewed the images of the brain MRI from 11/22/2017 and reviewed the report.    There are some scattered T2/FLAIR hyperintense foci in the periventricular and deep white matter of the hemispheres.  Most of these are small.   Some more in the corpus callosum.  This is a nonspecific finding and could be due to chronic microvascular ischemic changes or to demyelination.  None of the lesions appear to be acute.  The pituitary gland appeared normal.  He has never had any definite neurologic symptoms worrisome for an MS exacerbation.  Specifically, he has not had any episode lasting more than 24 hours of numbness, clumsiness, weakness, visual change, diplopia.  He does have some urinary frequency but he notes that this seems to be related to his sugars.  He has fatigue the next  day when he does not sleep well.  He has difficulty sleeping many nights..    There is more problem falling asleep than staying asleep.      Mood is fine.  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  He was hospitalized for a couple months.  He was intubated for part of that time.  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  According to the mother,  MRI's showed brain edema and a were okay there were abnormal spots on the brain.   He had another MRI after he had recovered and the mother was told that it looked good.Marland Kitchen  However, the report from 11/20/1998 states "there are 4 small punctate foci of increased T2 signal in the deep white matter adjacent to the right occipital horn and also involving the right upper parietal deep white matter.  The etiology of these foci is uncertain."  Unfortunately, the films are not available for comparison.  He has had some trouble controlling his glucose and has had episodes of severe hyper glycemia as well as hypoglycemia over the years.  He has a strong family history of Ehlers Danlos syndrome.  Besides himself, all of his siblings and his mother have the disease.  One relative was reportedly DNA typed to type I.     REVIEW OF SYSTEMS: Constitutional: No fevers, chills, sweats, or change in appetite.  he has insomnia.    Eyes: No visual changes, double vision, eye pain Ear, nose  and throat: No hearing loss, ear pain, nasal congestion, sore throat Cardiovascular: No chest pain, palpitations Respiratory: No shortness of breath at rest or with exertion.   No wheezes GastrointestinaI: No nausea, vomiting, diarrhea, abdominal pain, fecal incontinence Genitourinary: No dysuria, urinary retention or frequency.  No nocturia. Musculoskeletal:He notes occasional neck or back pain.  He has hypermobile joints Integumentary: No rash, pruritus, skin lesions Neurological: as above Psychiatric: No depression at this  time.  No anxiety Endocrine: No palpitations, diaphoresis, change in appetite, change in weigh or increased thirst Hematologic/Lymphatic: No anemia, purpura, petechiae. Allergic/Immunologic: No itchy/runny eyes, nasal congestion, recent allergic reactions, rashes  ALLERGIES: Allergies  Allergen Reactions  . Sulfa Antibiotics Rash    unknown Other reaction(s): Unknown    HOME MEDICATIONS:  Current Outpatient Medications:  .  insulin aspart (NOVOLOG) 100 UNIT/ML injection, Inject 8-14 Units into the skin 3 (three) times daily with meals. Based on mealtime doses ad SSI, Disp: 15 mL, Rfl: 2 .  insulin degludec (TRESIBA FLEXTOUCH) 100 UNIT/ML SOPN FlexTouch Pen, Inject 42 Units into the skin daily at 10 pm., Disp: , Rfl:  .  ONE TOUCH ULTRA TEST test strip, 1 each by Other route 4 (four) times daily. , Disp: , Rfl: 1 .  insulin detemir (LEVEMIR) 100 UNIT/ML injection, Inject 0.28 mLs (28 Units total) into the skin at bedtime. (Patient not taking: Reported on 12/05/2017), Disp: 15 mL, Rfl: 2 .  oxyCODONE-acetaminophen (ROXICET) 5-325 MG per tablet, Take 1-2 tablets by mouth every 4 (four) hours as needed (Pain). (Patient not taking: Reported on 09/28/2017), Disp: 60 tablet, Rfl: 0  PAST MEDICAL HISTORY: Past Medical History:  Diagnosis Date  . Congenital heart problem   . Diabetes mellitus without complication ([place])   . Skin disorder     PAST SURGICAL HISTORY: Past Surgical History:  Procedure Laterality Date  . CLOSED REDUCTION METACARPAL WITH PERCUTANEOUS PINNING Right 11/08/2014   Procedure: CLOSED REDUCTION METACARPAL WITH PERCUTANEOUS PINNING;  Surgeon: Renette Butters, MD;  Location: [place];  Service: Orthopedics;  Laterality: Right;  . I&D EXTREMITY Right 11/08/2014   Procedure: IRRIGATION AND DEBRIDEMENT EXTREMITY POSSIBLE WOUND VAC PLACEMENT;  Surgeon: Renette Butters, MD;  Location: [place];  Service: Orthopedics;  Laterality: Right;  . I&D EXTREMITY Right 11/11/2014   Procedure:  IRRIGATION AND DEBRIDEMENT EXTREMITY POSSIBLE WOUND VAC PLACEMENT;  Surgeon: Renette Butters, MD;  Location: [place];  Service: Orthopedics;  Laterality: Right;  . KNEE SURGERY      FAMILY HISTORY: Family History  Problem Relation Age of Onset  . Hypertension Mother   . Diabetes type II Mother   . Ehlers-Danlos syndrome Mother   . Ehlers-Danlos syndrome Sister   . Ehlers-Danlos syndrome Brother   . Hypertension Father   . Ehlers-Danlos syndrome Sister   . Ehlers-Danlos syndrome Brother     SOCIAL HISTORY:  Social History   Socioeconomic History  . Marital status: Single    Spouse name: Not on file  . Number of children: Not on file  . Years of education: Not on file  . Highest education level: Not on file  Occupational History  . Not on file  Social Needs  . Financial resource strain: Not on file  . Food insecurity:    Worry: Not on file    Inability: Not on file  . Transportation needs:    Medical: Not on file    Non-medical: Not on file  Tobacco Use  . Smoking status: Never Smoker  .  Smokeless tobacco: Never Used  Substance and Sexual Activity  . Alcohol use: No  . Drug use: No  . Sexual activity: Not on file  Lifestyle  . Physical activity:    Days per week: Not on file    Minutes per session: Not on file  . Stress: Not on file  Relationships  . Social connections:    Talks on phone: Not on file    Gets together: Not on file    Attends religious service: Not on file    Active member of club or organization: Not on file    Attends meetings of clubs or organizations: Not on file    Relationship status: Not on file  . Intimate partner violence:    Fear of current or ex partner: Not on file    Emotionally abused: Not on file    Physically abused: Not on file    Forced sexual activity: Not on file  Other Topics Concern  . Not on file  Social History Narrative  . Not on file     PHYSICAL EXAM  Vitals:   12/05/17 0853  BP: 123/75  Pulse: 80  Resp:  14  Weight: 173 lb (78.5 kg)  Height: 5\' 7"  (1.702 m)    Body mass index is 27.1 kg/m.   General: The patient is well-developed and well-nourished and in no acute distress  Eyes:  Funduscopic exam shows normal optic discs and retinal vessels.  Neck: The neck is supple, no carotid bruits are noted.  The neck is nontender.  Cardiovascular: The heart has a regular rate and rhythm with a normal S1 and S2. There were no murmurs, gallops or rubs. Lungs are clear to auscultation.  Skin: Extremities are without significant edema.  Musculoskeletal:  Back is nontender  Neurologic Exam  Mental status: The patient is alert and oriented x 3 at the time of the examination. The patient has apparent normal recent and remote memory, with an apparently normal attention span and concentration ability.   Speech is normal.  Cranial nerves: Extraocular movements are full. Pupils are equal, round, and reactive to light and accomodation.  Visual fields are full.  Facial symmetry is present. There is good facial sensation to soft touch bilaterally.Facial strength is normal.  Trapezius and sternocleidomastoid strength is normal. No dysarthria is noted.  The tongue is midline, and the patient has symmetric elevation of the soft palate. No obvious hearing deficits are noted.  Motor:  Muscle bulk is normal.   Tone is normal. Strength is  5 / 5 in all 4 extremities.   Sensory: Sensory testing is intact to pinprick, soft touch and vibration sensation in all 4 extremities.  Coordination: Cerebellar testing reveals good finger-nose-finger and heel-to-shin bilaterally.  Gait and station: Station is normal.   Gait is normal. Tandem gait is normal. Romberg is negative.   Reflexes: Deep tendon reflexes are symmetric and normal bilaterally.   Plantar responses are flexor.    DIAGNOSTIC DATA (LABS, IMAGING, TESTING) - I reviewed patient records, labs, notes, testing and imaging myself where available.  Lab  Results  Component Value Date   WBC 6.6 10/15/2017   HGB 16.0 10/15/2017   HCT 45.2 10/15/2017   MCV 91.3 10/15/2017   PLT 172 10/15/2017      Component Value Date/Time   NA 135 10/15/2017 1620   K 4.5 10/15/2017 1620   CL 104 10/15/2017 1620   CO2 24 10/15/2017 1620   GLUCOSE 179 (H) 10/15/2017 1620  BUN 23 (H) 10/15/2017 1620   CREATININE 1.00 10/15/2017 1620   CALCIUM 8.4 (L) 10/15/2017 1620   PROT 7.0 10/15/2017 1620   ALBUMIN 4.1 10/15/2017 1620   AST 29 10/15/2017 1620   ALT 21 10/15/2017 1620   ALKPHOS 71 10/15/2017 1620   BILITOT 0.7 10/15/2017 1620   GFRNONAA >60 10/15/2017 1620   GFRAA >60 10/15/2017 1620   Lab Results  Component Value Date   CHOL 180 02/11/2014   HDL 47.60 02/11/2014   LDLCALC 72 02/11/2014   TRIG 302.0 (H) 02/11/2014   CHOLHDL 4 02/11/2014   Lab Results  Component Value Date   HGBA1C 9.2 (H) 11/16/2014   No results found for: VITAMINB12 Lab Results  Component Value Date   TSH 0.99 02/11/2014       ASSESSMENT AND PLAN  Ehlers-Danlos syndrome  Erectile dysfunction, unspecified erectile dysfunction type - Plan: MR CERVICAL SPINE WO CONTRAST  Abnormal MRI of head  Urinary frequency  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 Vandeberg is a 32 year old man with an abnormal brain MRI potentially worrisome for multiple sclerosis.  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  The current brain MRI shows foci in the corpus callosum as well as in the deep white matter.  I cannot rule out demyelination such as MS.  However, an MRI at age 14 showed that he had multiple spots at that time (films are unavailable).  Therefore, I believe it is more likely that they are related to his encephalitis with possibly some additional ones from diabetes.  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  Some patients with that abnormalities limited to the imaging go on to eventually be diagnosed with MS.  I discussed with him and his family that I feel the likelihood that he has MS is probably about 25%.  Therefore, we will need to do some additional testing to help determine if he does indeed have the disorder.  I will check an MRI of the cervical spine.  If he has classic peripheral plaques in the spinal cord that MS is more likely.  We could do a lumbar puncture to determine if he has oligoclonal bands.  However, with the likelihood below 50% another option would be to recheck the MRI in about 8 to 9 months.   If he does have an increase in the number of lesions, then MS is more likely.  Based on the extent of changes, the diagnosis could be made or we might still need to check a lumbar puncture.  If that MRI shows no new lesions I would probably check in 1 and an additional 9 to 12 months.  If that one also shows no changes, then the likelihood that this is a mass is very low (probably less than 5%).   He does have some insomnia.  If this worsens, consider adding a sleep promoting agent.  He will return to see me in 8 months or sooner if there are new or worsening neurologic symptoms.  Thank you for asking me to see Mr. Zehren.  Please let me know if I can be of further assistance with him or other patients in the future.  Arber Wiemers A. Felecia Shelling, MD, [facility] 9/32/3557, 32:20 AM Certified in Neurology, Clinical Neurophysiology, Sleep Medicine, Pain Medicine and Neuroimaging  [facility] [address], [address] [place], [place] [address] [phone] 

## 2017-12-13 ENCOUNTER — Ambulatory Visit
Admission: RE | Admit: 2017-12-13 | Discharge: 2017-12-13 | Disposition: A | Discharge status: Home / Self Care | Payer: BLUE CROSS/BLUE SHIELD | Source: Ambulatory Visit | Attending: Neurology | Admitting: Neurology

## 2017-12-13 DIAGNOSIS — N529 Male erectile dysfunction, unspecified: Secondary | ICD-10-CM

## 2017-12-14 ENCOUNTER — Telehealth: Payer: Self-pay | Admitting: *Deleted

## 2017-12-14 NOTE — Telephone Encounter (Signed)
Spoke with pt. and reviewed below MRI report.  She verbalized understanding of same/fim

## 2017-12-14 NOTE — Telephone Encounter (Signed)
-----   Message from Britt Bottom, MD sent at 12/13/2017  6:25 PM EDT ----- Please let the patient know that the MRI is fairly normal --- just a couple very small disc bulges.

## 2018-04-14 IMAGING — DX DG CHEST 1V PORT
1 series · 1 of 1 positions shown · non-contrast
Comparison: Chest x-ray dated 07/13/2017

CLINICAL DATA: Vomiting.  Unresponsive at home.

EXAM:
PORTABLE CHEST 1 VIEW

[chest ap]
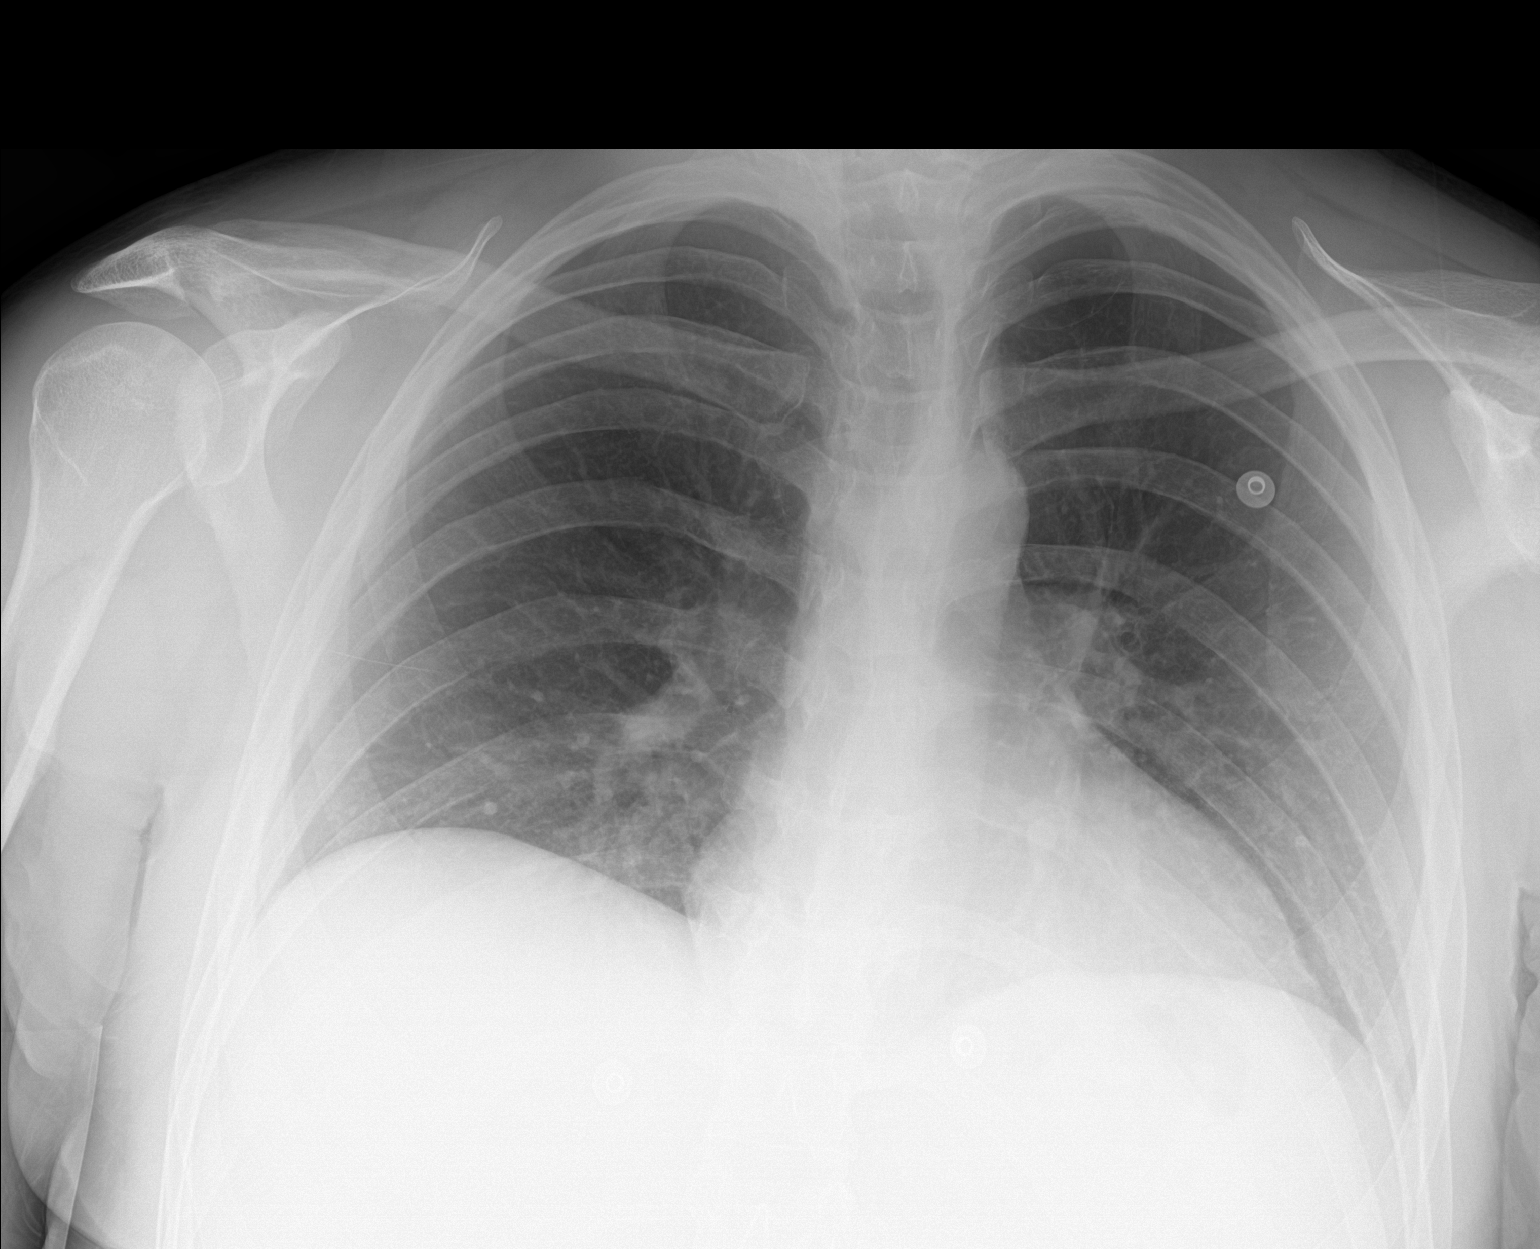

[1 of 1 positions shown; findings below may reference images not displayed]

FINDINGS: Heart size and mediastinal contours are within normal limits. Lungs
are clear. No pleural effusion or pneumothorax seen. Osseous
structures about the chest are unremarkable.
IMPRESSION: No active disease.  No evidence of pneumonia or pulmonary edema.

## 2018-08-13 ENCOUNTER — Ambulatory Visit: Payer: BLUE CROSS/BLUE SHIELD | Admitting: Neurology

## 2018-08-14 ENCOUNTER — Encounter: Payer: Self-pay | Admitting: Neurology

## 2020-09-18 ENCOUNTER — Observation Stay (HOSPITAL_COMMUNITY)
Admission: EM | Admit: 2020-09-18 | Discharge: 2020-09-20 | Disposition: A | Discharge status: Home / Self Care | Payer: BC Managed Care – PPO | Attending: Internal Medicine | Admitting: Internal Medicine

## 2020-09-18 ENCOUNTER — Other Ambulatory Visit: Payer: Self-pay

## 2020-09-18 DIAGNOSIS — E1010 Type 1 diabetes mellitus with ketoacidosis without coma: Secondary | ICD-10-CM | POA: Diagnosis not present

## 2020-09-18 DIAGNOSIS — Z20822 Contact with and (suspected) exposure to covid-19: Secondary | ICD-10-CM | POA: Insufficient documentation

## 2020-09-18 DIAGNOSIS — R112 Nausea with vomiting, unspecified: Secondary | ICD-10-CM | POA: Diagnosis not present

## 2020-09-18 DIAGNOSIS — Z791 Long term (current) use of non-steroidal anti-inflammatories (NSAID): Secondary | ICD-10-CM | POA: Insufficient documentation

## 2020-09-18 DIAGNOSIS — E1110 Type 2 diabetes mellitus with ketoacidosis without coma: Secondary | ICD-10-CM | POA: Diagnosis present

## 2020-09-18 DIAGNOSIS — N179 Acute kidney failure, unspecified: Secondary | ICD-10-CM | POA: Diagnosis not present

## 2020-09-18 DIAGNOSIS — Q796 Ehlers-Danlos syndrome, unspecified: Secondary | ICD-10-CM

## 2020-09-18 DIAGNOSIS — Z794 Long term (current) use of insulin: Secondary | ICD-10-CM | POA: Insufficient documentation

## 2020-09-18 DIAGNOSIS — D72829 Elevated white blood cell count, unspecified: Secondary | ICD-10-CM | POA: Insufficient documentation

## 2020-09-18 LAB — CBC
HCT: 50.1 % (ref 39.0–52.0)
Hemoglobin: 17.5 g/dL — ABNORMAL HIGH (ref 13.0–17.0)
MCH: 32.8 pg (ref 26.0–34.0)
MCHC: 34.9 g/dL (ref 30.0–36.0)
MCV: 94 fL (ref 80.0–100.0)
Platelets: 286 10*3/uL (ref 150–400)
RBC: 5.33 MIL/uL (ref 4.22–5.81)
RDW: 12.3 % (ref 11.5–15.5)
WBC: 13.7 10*3/uL — ABNORMAL HIGH (ref 4.0–10.5)
nRBC: 0 % (ref 0.0–0.2)

## 2020-09-18 LAB — BASIC METABOLIC PANEL
Anion gap: 21 — ABNORMAL HIGH (ref 5–15)
BUN: 38 mg/dL — ABNORMAL HIGH (ref 6–20)
CO2: 16 mmol/L — ABNORMAL LOW (ref 22–32)
Calcium: 10.2 mg/dL (ref 8.9–10.3)
Chloride: 102 mmol/L (ref 98–111)
Creatinine, Ser: 1.77 mg/dL — ABNORMAL HIGH (ref 0.61–1.24)
GFR, Estimated: 51 mL/min — ABNORMAL LOW (ref >60)
Glucose, Bld: 215 mg/dL — ABNORMAL HIGH (ref 70–99)
Potassium: 4.1 mmol/L (ref 3.5–5.1)
Sodium: 139 mmol/L (ref 135–145)

## 2020-09-18 LAB — URINALYSIS, ROUTINE W REFLEX MICROSCOPIC
Bacteria, UA: NONE SEEN
Bilirubin Urine: NEGATIVE
Glucose, UA: >=500 mg/dL — AB
Hgb urine dipstick: NEGATIVE
Ketones, ur: 80 mg/dL — AB
Leukocytes,Ua: NEGATIVE
Nitrite: NEGATIVE
Protein, ur: NEGATIVE mg/dL
Specific Gravity, Urine: 1.026 (ref 1.005–1.030)
pH: 5 (ref 5.0–8.0)

## 2020-09-18 LAB — CBG MONITORING, ED: Glucose-Capillary: 210 mg/dL — ABNORMAL HIGH (ref 70–99)

## 2020-09-18 MED ORDER — ONDANSETRON 4 MG PO TBDP
4.0000 mg | ORAL_TABLET | Freq: Once | ORAL | Status: AC
  Administered 2020-09-18: 4 mg via ORAL
  Filled 2020-09-18: qty 1

## 2020-09-18 NOTE — ED Triage Notes (Signed)
Pt presents to ED POV. Pt c/o hyperglycemia and emesis. Pt reports emesis since 0100 this morning. Pt reports his cbg was 300-500 at home.

## 2020-09-19 ENCOUNTER — Encounter (HOSPITAL_COMMUNITY): Payer: Self-pay | Admitting: Internal Medicine

## 2020-09-19 DIAGNOSIS — E1010 Type 1 diabetes mellitus with ketoacidosis without coma: Secondary | ICD-10-CM | POA: Diagnosis not present

## 2020-09-19 DIAGNOSIS — N179 Acute kidney failure, unspecified: Secondary | ICD-10-CM | POA: Diagnosis not present

## 2020-09-19 DIAGNOSIS — E0810 Diabetes mellitus due to underlying condition with ketoacidosis without coma: Secondary | ICD-10-CM

## 2020-09-19 DIAGNOSIS — E1110 Type 2 diabetes mellitus with ketoacidosis without coma: Secondary | ICD-10-CM | POA: Diagnosis present

## 2020-09-19 LAB — BASIC METABOLIC PANEL
Anion gap: 14 (ref 5–15)
Anion gap: 9 (ref 5–15)
BUN: 31 mg/dL — ABNORMAL HIGH (ref 6–20)
BUN: 27 mg/dL — ABNORMAL HIGH (ref 6–20)
CO2: 18 mmol/L — ABNORMAL LOW (ref 22–32)
CO2: 23 mmol/L (ref 22–32)
Calcium: 8.6 mg/dL — ABNORMAL LOW (ref 8.9–10.3)
Calcium: 8.5 mg/dL — ABNORMAL LOW (ref 8.9–10.3)
Chloride: 106 mmol/L (ref 98–111)
Chloride: 106 mmol/L (ref 98–111)
Creatinine, Ser: 1.24 mg/dL (ref 0.61–1.24)
Creatinine, Ser: 1.17 mg/dL (ref 0.61–1.24)
GFR, Estimated: >60 mL/min (ref >60)
GFR, Estimated: >60 mL/min (ref >60)
Glucose, Bld: 191 mg/dL — ABNORMAL HIGH (ref 70–99)
Glucose, Bld: 223 mg/dL — ABNORMAL HIGH (ref 70–99)
Potassium: 3.8 mmol/L (ref 3.5–5.1)
Potassium: 4 mmol/L (ref 3.5–5.1)
Sodium: 138 mmol/L (ref 135–145)
Sodium: 138 mmol/L (ref 135–145)

## 2020-09-19 LAB — CBG MONITORING, ED
Glucose-Capillary: 159 mg/dL — ABNORMAL HIGH (ref 70–99)
Glucose-Capillary: 169 mg/dL — ABNORMAL HIGH (ref 70–99)
Glucose-Capillary: 171 mg/dL — ABNORMAL HIGH (ref 70–99)
Glucose-Capillary: 178 mg/dL — ABNORMAL HIGH (ref 70–99)
Glucose-Capillary: 180 mg/dL — ABNORMAL HIGH (ref 70–99)
Glucose-Capillary: 181 mg/dL — ABNORMAL HIGH (ref 70–99)
Glucose-Capillary: 186 mg/dL — ABNORMAL HIGH (ref 70–99)
Glucose-Capillary: 190 mg/dL — ABNORMAL HIGH (ref 70–99)
Glucose-Capillary: 203 mg/dL — ABNORMAL HIGH (ref 70–99)
Glucose-Capillary: 204 mg/dL — ABNORMAL HIGH (ref 70–99)
Glucose-Capillary: 215 mg/dL — ABNORMAL HIGH (ref 70–99)
Glucose-Capillary: 229 mg/dL — ABNORMAL HIGH (ref 70–99)
Glucose-Capillary: 238 mg/dL — ABNORMAL HIGH (ref 70–99)

## 2020-09-19 LAB — CBC
HCT: 45.6 % (ref 39.0–52.0)
Hemoglobin: 14.9 g/dL (ref 13.0–17.0)
MCH: 32.3 pg (ref 26.0–34.0)
MCHC: 32.7 g/dL (ref 30.0–36.0)
MCV: 98.7 fL (ref 80.0–100.0)
Platelets: 205 10*3/uL (ref 150–400)
RBC: 4.62 MIL/uL (ref 4.22–5.81)
RDW: 12.5 % (ref 11.5–15.5)
WBC: 10.4 10*3/uL (ref 4.0–10.5)
nRBC: 0 % (ref 0.0–0.2)

## 2020-09-19 LAB — GLUCOSE, CAPILLARY
Glucose-Capillary: 186 mg/dL — ABNORMAL HIGH (ref 70–99)
Glucose-Capillary: 279 mg/dL — ABNORMAL HIGH (ref 70–99)
Glucose-Capillary: 261 mg/dL — ABNORMAL HIGH (ref 70–99)
Glucose-Capillary: 216 mg/dL — ABNORMAL HIGH (ref 70–99)

## 2020-09-19 LAB — RESP PANEL BY RT-PCR (FLU A&B, COVID) ARPGX2
Influenza A by PCR: NEGATIVE
Influenza B by PCR: NEGATIVE
SARS Coronavirus 2 by RT PCR: NEGATIVE

## 2020-09-19 LAB — BETA-HYDROXYBUTYRIC ACID: Beta-Hydroxybutyric Acid: 4 mmol/L — ABNORMAL HIGH (ref 0.05–0.27)

## 2020-09-19 LAB — HEMOGLOBIN A1C
Hgb A1c MFr Bld: 7.9 % — ABNORMAL HIGH (ref 4.8–5.6)
Mean Plasma Glucose: 180.03 mg/dL

## 2020-09-19 LAB — HIV ANTIBODY (ROUTINE TESTING W REFLEX): HIV Screen 4th Generation wRfx: NONREACTIVE

## 2020-09-19 MED ORDER — DEXTROSE 50 % IV SOLN
0.0000 mL | INTRAVENOUS | Status: DC | PRN
Start: 2020-09-19 — End: 2020-09-20

## 2020-09-19 MED ORDER — SODIUM CHLORIDE 0.9 % IV BOLUS
1000.0000 mL | Freq: Once | INTRAVENOUS | Status: AC
  Administered 2020-09-19: 1000 mL via INTRAVENOUS

## 2020-09-19 MED ORDER — SODIUM CHLORIDE 0.9 % IV SOLN: INTRAVENOUS | Status: AC

## 2020-09-19 MED ORDER — INSULIN GLARGINE 100 UNIT/ML ~~LOC~~ SOLN
15.0000 [IU] | Freq: Two times a day (BID) | SUBCUTANEOUS | Status: DC
  Administered 2020-09-19: 15 [IU] via SUBCUTANEOUS
  Filled 2020-09-19 (×7): qty 0.15

## 2020-09-19 MED ORDER — LACTATED RINGERS IV SOLN: INTRAVENOUS | Status: DC

## 2020-09-19 MED ORDER — INSULIN REGULAR(HUMAN) IN NACL 100-0.9 UT/100ML-% IV SOLN: INTRAVENOUS | Status: DC

## 2020-09-19 MED ORDER — INSULIN ASPART 100 UNIT/ML ~~LOC~~ SOLN
0.0000 [IU] | Freq: Three times a day (TID) | SUBCUTANEOUS | Status: DC
  Administered 2020-09-20: 5 [IU] via SUBCUTANEOUS
  Administered 2020-09-20: 8 [IU] via SUBCUTANEOUS

## 2020-09-19 MED ORDER — LACTATED RINGERS IV BOLUS
20.0000 mL/kg | Freq: Once | INTRAVENOUS | Status: AC
  Administered 2020-09-19: 1542 mL via INTRAVENOUS

## 2020-09-19 MED ORDER — ENOXAPARIN SODIUM 40 MG/0.4ML ~~LOC~~ SOLN: 40.0000 mg | Freq: Every day | SUBCUTANEOUS | Status: DC

## 2020-09-19 MED ORDER — INSULIN REGULAR(HUMAN) IN NACL 100-0.9 UT/100ML-% IV SOLN
INTRAVENOUS | Status: DC
  Administered 2020-09-19: 8 [IU]/h via INTRAVENOUS
  Filled 2020-09-19 (×2): qty 100

## 2020-09-19 MED ORDER — POTASSIUM CHLORIDE 10 MEQ/100ML IV SOLN
10.0000 meq | INTRAVENOUS | Status: AC
Start: 2020-09-19 — End: 2020-09-19
  Administered 2020-09-19 (×2): 10 meq via INTRAVENOUS
  Filled 2020-09-19 (×2): qty 100

## 2020-09-19 MED ORDER — DEXTROSE 50 % IV SOLN: 0.0000 mL | INTRAVENOUS | Status: DC | PRN

## 2020-09-19 MED ORDER — DEXTROSE IN LACTATED RINGERS 5 % IV SOLN: INTRAVENOUS | Status: DC

## 2020-09-19 MED ORDER — INSULIN ASPART 100 UNIT/ML ~~LOC~~ SOLN
0.0000 [IU] | Freq: Every day | SUBCUTANEOUS | Status: DC
  Administered 2020-09-19: 2 [IU] via SUBCUTANEOUS

## 2020-09-19 NOTE — Progress Notes (Signed)
Patient arrived from ED to 4e23. Vial signs obtained and telemetry placed box [id number] ccmd made aware. Pt cbg obtained patient arrived with insulin infusing. Will monitor patient.Priseis Cratty, Bettina Gavia RN

## 2020-09-19 NOTE — ED Notes (Signed)
Checked pt CBG @ 10:05, resulted @ 190...will check again at 11:00

## 2020-09-19 NOTE — ED Notes (Signed)
Pt Q1 CBG checked @ 08:57, resulted @ 186
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
## 2020-09-19 NOTE — Progress Notes (Signed)
[facility] PROGRESS NOTE    Progress Note  [name]  EXH:[id number] DOB: Sep 18, 1985 DOA: 09/18/2020 PCP: Leonard Downing, MD     Brief Narrative:   [name] is an 35 y.o. male past medical history of diabetes mellitus type 1, Ehlers-Danlos syndrome present to the ED with nausea and vomiting with a sugar running around 500.  In the ER with anion gap of 21, bicarb of 16, with positive ketones.   Assessment/Plan:   DKA (diabetic ketoacidosis) ([place]) Continue IV insulin and IV fluids he appears dry on physical exam we will give him another liter of normal saline. We will start him on Lantus 15 units he did not bring his equipment for his insulin pump from home. Once his anion gap is closed and his bicarb is greater than 20 we will start him on sliding scale. Allow a diet and oral hydration.  Basic metabolic panel is pending this morning. For nausea.  Acute kidney injury: Likely prerenal azotemia in the setting of DKA we will continue aggressive IV fluid hydration and oral hydration recheck basic metabolic panel in 4 hours.  Basic metabolic panel this morning is pending.  Intractable nausea and vomiting: Likely due to DKA use Zofran abdominal exam is benign.  Leukocytosis: Reactive has remained afebrile.   DVT prophylaxis: Lovenox Family Communication: Father Status is: Observation  The patient remains OBS appropriate and will d/c before 2 midnights.  Dispo: The patient is from: Home              Anticipated d/c is to: Home              Anticipated d/c date is: 1 day              Patient currently is not medically stable to d/c.   Difficult to place patient No        Code Status:     Code Status Orders  (From admission, onward)         Start     Ordered   09/19/20 0434  Full code  Continuous        09/19/20 0434        Code Status History    Date Active Date Inactive Code Status Order ID Comments User Context   10/15/2017 2259  10/16/2017 0426 Full Code [id number]  Daleen Bo, MD ED   11/11/2014 1603 11/17/2014 2100 Full Code [id number]  Lovett Calender, PA-C Inpatient   11/08/2014 1603 11/11/2014 1603 Full Code [id number]  Lovett Calender, PA-C Inpatient   11/08/2014 0532 11/08/2014 1603 Full Code [id number]  Georganna Skeans, MD Inpatient   Advance Care Planning Activity        IV Access:    Peripheral IV   Procedures and diagnostic studies:   No results found.   Medical Consultants:    None.   Subjective:    [name] relates he continues to have persistent nausea and vomiting.  Objective:    Vitals:   09/19/20 0400 09/19/20 0430 09/19/20 0500 09/19/20 0530  BP: (!) 145/89 (!) 139/94 (!) 134/91 130/89  Pulse: (!) 104 (!) 102 98 99  Resp: 20 20 15 17   Temp: 98.3 F (36.8 C)     TempSrc: Oral     SpO2: 100% 98% 97% 97%  Weight:   77.1 kg    SpO2: 97 %  No intake or output data in the 24 hours ending 09/19/20 0700 Filed Weights   09/19/20 0500  Weight: 77.1 kg    Exam: General exam: In no acute distress. Respiratory system: Good air movement and clear to auscultation. Cardiovascular system: S1 & S2 heard, RRR.  Gastrointestinal system: Abdomen is nondistended, soft and nontender.  Extremities: No pedal edema. Skin: No rashes, lesions or ulcers Psychiatry: Judgement and insight appear normal. Mood & affect appropriate.    Data Reviewed:    Labs: Basic Metabolic Panel: Recent Labs  Lab 09/18/20 2019  NA 139  K 4.1  CL 102  CO2 16*  GLUCOSE 215*  BUN 38*  CREATININE 1.77*  CALCIUM 10.2   GFR CrCl cannot be calculated (Unknown ideal weight.). Liver Function Tests: No results for input(s): AST, ALT, ALKPHOS, BILITOT, PROT, ALBUMIN in the last 168 hours. No results for input(s): LIPASE, AMYLASE in the last 168 hours. No results for input(s): AMMONIA in the last 168 hours. Coagulation profile No results for input(s): INR, PROTIME in the last 168 hours. COVID-19  Labs  No results for input(s): DDIMER, FERRITIN, LDH, CRP in the last 72 hours.  No results found for: SARSCOV2NAA  CBC: Recent Labs  Lab 09/18/20 2019 09/19/20 0450  WBC 13.7* 10.4  HGB 17.5* 14.9  HCT 50.1 45.6  MCV 94.0 98.7  PLT 286 205   Cardiac Enzymes: No results for input(s): CKTOTAL, CKMB, CKMBINDEX, TROPONINI in the last 168 hours. BNP (last 3 results) No results for input(s): PROBNP in the last 8760 hours. CBG: Recent Labs  Lab 09/19/20 0000 09/19/20 0205 09/19/20 0358 09/19/20 0542 09/19/20 0648  GLUCAP 169* 215* 238* 229* 180*   D-Dimer: No results for input(s): DDIMER in the last 72 hours. Hgb A1c: Recent Labs    09/19/20 0450  HGBA1C 7.9*   Lipid Profile: No results for input(s): CHOL, HDL, LDLCALC, TRIG, CHOLHDL, LDLDIRECT in the last 72 hours. Thyroid function studies: No results for input(s): TSH, T4TOTAL, T3FREE, THYROIDAB in the last 72 hours.  Invalid input(s): FREET3 Anemia work up: No results for input(s): VITAMINB12, FOLATE, FERRITIN, TIBC, IRON, RETICCTPCT in the last 72 hours. Sepsis Labs: Recent Labs  Lab 09/18/20 2019 09/19/20 0450  WBC 13.7* 10.4   Microbiology No results found for this or any previous visit (from the past 240 hour(s)).   Medications:   . enoxaparin (LOVENOX) injection  40 mg Subcutaneous Daily   Continuous Infusions: . dextrose 5% lactated ringers 125 mL/hr at 09/19/20 0548  . insulin 1 Units/hr (09/19/20 0649)  . lactated ringers Stopped (09/19/20 0547)  . potassium chloride 10 mEq (09/19/20 0622)      LOS: 0 days   Charlynne Cousins  [facility]  09/19/2020, 7:00 AM

## 2020-09-19 NOTE — Progress Notes (Addendum)
Inpatient Diabetes Program Recommendations  AACE/ADA: New Consensus Statement on Inpatient Glycemic Control (2015)  Target Ranges:  Prepandial:   less than 140 mg/dL      Peak postprandial:   less than 180 mg/dL (1-2 hours)      Critically ill patients:  140 - 180 mg/dL   Lab Results  Component Value Date   GLUCAP 190 (H) 09/19/2020   HGBA1C 7.9 (H) 09/19/2020    Review of Glycemic Control Results for [name], [name] (MRN [id number]) as of 09/19/2020 10:33  Ref. Range 09/19/2020 07:47 09/19/2020 08:57 09/19/2020 10:05  Glucose-Capillary Latest Ref Range: 70 - 99 mg/dL 204 (H) 186 (H) 190 (H)  Results for [name], [name] (MRN [id number]) as of 09/19/2020 10:33  Ref. Range 09/18/2020 20:19  CO2 Latest Ref Range: 22 - 32 mmol/L 16 (L)  Glucose Latest Ref Range: 70 - 99 mg/dL 215 (H)  BUN Latest Ref Range: 6 - 20 mg/dL 38 (H)  Creatinine Latest Ref Range: 0.61 - 1.24 mg/dL 1.77 (H)  Calcium Latest Ref Range: 8.9 - 10.3 mg/dL 10.2  Anion gap Latest Ref Range: 5 - 15  21 (H)  Results for [name], [name] (MRN [id number]) as of 09/19/2020 10:33  Ref. Range 09/19/2020 04:50  Beta-Hydroxybutyric Acid Latest Ref Range: 0.05 - 0.27 mmol/L 4.00 (H)   Diabetes history: DM1 Outpatient Diabetes medications: Tresiba 42 units QHS, Novolog 8-14 units TID Current orders for Inpatient glycemic control: IV insulin transitioning to Lantus 15 units BID  Inpatient Diabetes Program Recommendations  Lantus not administered yet. Based on previous labs still indicative of acidosis. New lab orders for BMET placed this AM.  Would recommend continuing with IV insulin at this time.  Could consider adding beta hydroxybutyric acid now, and repeat Q8H until acidosis clears. Secure chat sent to MD and RN.  Discussed with RN to obtain BMET and assess for acidosis. Once plan for transition, will also need SSI and meal coverage.   Thanks, Bronson Curb, MSN, RNC-OB Diabetes Coordinator [phone] (8a-5p)

## 2020-09-19 NOTE — Progress Notes (Signed)
Results showed that the patient's anion gap is 9 and his CO2 is 23.  His recent blood sugar was 261 while the insulin drip is at a rate of 6 Units/hr.  Informed the on call hospitalist. He put in a few orders. First, stop the IV insulin and Lactated Ringers with 5% Dextrose drips. Check the patient's blood sugar before meals and at bedtime. The doctor started the patient on sliding scales for subcutaneous Novolog insulin three times a day with meals and a separate one daily at bed time. Also, start IV normal saline at 100 mL/hr continuously.  Patient currently resting comfortably in bed with call bell in reach.  Will continue to monitor.  Lupita Dawn, RN

## 2020-09-19 NOTE — ED Notes (Signed)
BMP collected at this time,

## 2020-09-19 NOTE — H&P (Signed)
History and Physical    [name] GLO:[id number] DOB: 1986-02-19 DOA: 09/18/2020  PCP: Leonard Downing, MD   Patient coming from: Home.  Chief Complaint: Nausea vomiting.  HPI: [name] is a 35 y.o. male with history of diabetes mellitus type 1, Ehler Danlos syndrome presents to the ER because of elevated blood sugar and nausea vomiting.  Patient states he may have had his insulin pump dislodged after hitting on furniture 2 days ago.  Since yesterday morning patient has been having intractable nausea vomiting and blood sugars were running in the 300-500 range.  Has benign abdominal discomfort.  Denies any fever chills chest pain or shortness of breath.  ED Course: In the ER patient blood work showed anion gap of 21 with bicarb of 16 blood sugar of 215 urine shows ketones of 80.  EKG shows sinus tachycardia.  Patient admitted for diabetic ketoacidosis.  Started on insulin infusion and fluid bolus.  Review of Systems: As per HPI, rest all negative.   Past Medical History:  Diagnosis Date  . Congenital heart problem   . Diabetes mellitus without complication ([place])   . Skin disorder     Past Surgical History:  Procedure Laterality Date  . CLOSED REDUCTION METACARPAL WITH PERCUTANEOUS PINNING Right 11/08/2014   Procedure: CLOSED REDUCTION METACARPAL WITH PERCUTANEOUS PINNING;  Surgeon: Renette Butters, MD;  Location: [facility];  Service: Orthopedics;  Laterality: Right;  . I & D EXTREMITY Right 11/08/2014   Procedure: IRRIGATION AND DEBRIDEMENT EXTREMITY POSSIBLE WOUND VAC PLACEMENT;  Surgeon: Renette Butters, MD;  Location: [facility];  Service: Orthopedics;  Laterality: Right;  . I & D EXTREMITY Right 11/11/2014   Procedure: IRRIGATION AND DEBRIDEMENT EXTREMITY POSSIBLE WOUND VAC PLACEMENT;  Surgeon: Renette Butters, MD;  Location: [facility];  Service: Orthopedics;  Laterality: Right;  . KNEE SURGERY       reports that he has never smoked. He has never used smokeless tobacco. He  reports that he does not drink alcohol and does not use drugs.  Allergies  Allergen Reactions  . Sulfa Antibiotics Rash    unknown Other reaction(s): Unknown    Family History  Problem Relation Age of Onset  . Hypertension Mother   . Diabetes type II Mother   . Ehlers-Danlos syndrome Mother   . Ehlers-Danlos syndrome Sister   . Ehlers-Danlos syndrome Brother   . Hypertension Father   . Ehlers-Danlos syndrome Sister   . Ehlers-Danlos syndrome Brother     Prior to Admission medications   Medication Sig Start Date End Date Taking? Authorizing Provider  insulin aspart (NOVOLOG) 100 UNIT/ML injection Inject 8-14 Units into the skin 3 (three) times daily with meals. Based on mealtime doses ad SSI 06/30/14   Philemon Kingdom, MD  insulin degludec (TRESIBA FLEXTOUCH) 100 UNIT/ML SOPN FlexTouch Pen Inject 42 Units into the skin daily at 10 pm.    [provider]  insulin detemir (LEVEMIR) 100 UNIT/ML injection Inject 0.28 mLs (28 Units total) into the skin at bedtime. Patient not taking: Reported on 12/05/2017 06/30/14   Philemon Kingdom, MD  ONE TOUCH ULTRA TEST test strip 1 each by Other route 4 (four) times daily.  04/22/14   [provider]  oxyCODONE-acetaminophen (ROXICET) 5-325 MG per tablet Take 1-2 tablets by mouth every 4 (four) hours as needed (Pain). Patient not taking: Reported on 09/28/2017 11/17/14   [name], PA-C    Physical Exam: Constitutional: Moderately built and nourished. Vitals:  09/18/20 2341 09/19/20 0117 09/19/20 0325 09/19/20 0400  BP: 125/75 (!) 128/91 130/89 (!) 145/89  Pulse: (!) 112 (!) 110 (!) 104 (!) 104  Resp: 18 (!) 22 17 20   Temp: 98.1 F (36.7 C) 98.4 F (36.9 C)  98.3 F (36.8 C)  TempSrc: Oral Oral  Oral  SpO2: 100% 100% 100% 100%   Eyes: Anicteric no pallor. ENMT: No discharge from the ears eyes nose or mouth. Neck: No mass felt.  No neck rigidity. Respiratory: No rhonchi or crepitations. Cardiovascular:  S1-S2 heard. Abdomen: Soft nontender bowel sounds present. Musculoskeletal: No edema. Skin: No rash. Neurologic: Alert awake oriented to time place and person.  Moves all extremities. Psychiatric: Appears normal.  Normal affect.   Labs on Admission: I have personally reviewed following labs and imaging studies  CBC: Recent Labs  Lab 09/18/20 2019  WBC 13.7*  HGB 17.5*  HCT 50.1  MCV 94.0  PLT 947   Basic Metabolic Panel: Recent Labs  Lab 09/18/20 2019  NA 139  K 4.1  CL 102  CO2 16*  GLUCOSE 215*  BUN 38*  CREATININE 1.77*  CALCIUM 10.2   GFR: CrCl cannot be calculated (Unknown ideal weight.). Liver Function Tests: No results for input(s): AST, ALT, ALKPHOS, BILITOT, PROT, ALBUMIN in the last 168 hours. No results for input(s): LIPASE, AMYLASE in the last 168 hours. No results for input(s): AMMONIA in the last 168 hours. Coagulation Profile: No results for input(s): INR, PROTIME in the last 168 hours. Cardiac Enzymes: No results for input(s): CKTOTAL, CKMB, CKMBINDEX, TROPONINI in the last 168 hours. BNP (last 3 results) No results for input(s): PROBNP in the last 8760 hours. HbA1C: No results for input(s): HGBA1C in the last 72 hours. CBG: Recent Labs  Lab 09/18/20 1939 09/19/20 0000 09/19/20 0205 09/19/20 0358  GLUCAP 210* 169* 215* 238*   Lipid Profile: No results for input(s): CHOL, HDL, LDLCALC, TRIG, CHOLHDL, LDLDIRECT in the last 72 hours. Thyroid Function Tests: No results for input(s): TSH, T4TOTAL, FREET4, T3FREE, THYROIDAB in the last 72 hours. Anemia Panel: No results for input(s): VITAMINB12, FOLATE, FERRITIN, TIBC, IRON, RETICCTPCT in the last 72 hours. Urine analysis:    Component Value Date/Time   COLORURINE YELLOW 09/18/2020 1949   APPEARANCEUR CLEAR 09/18/2020 1949   LABSPEC 1.026 09/18/2020 1949   PHURINE 5.0 09/18/2020 1949   GLUCOSEU >=500 (A) 09/18/2020 1949   HGBUR NEGATIVE 09/18/2020 1949   BILIRUBINUR NEGATIVE 09/18/2020  1949   KETONESUR 80 (A) 09/18/2020 1949   PROTEINUR NEGATIVE 09/18/2020 1949   UROBILINOGEN 1.0 11/26/2007 0217   NITRITE NEGATIVE 09/18/2020 1949   LEUKOCYTESUR NEGATIVE 09/18/2020 1949   Sepsis Labs: @LABRCNTIP (procalcitonin:4,lacticidven:4) )No results found for this or any previous visit (from the past 240 hour(s)).   Radiological Exams on Admission: No results found.  EKG: Independently reviewed.  Sinus tachycardia.  Assessment/Plan Principal Problem:   DKA (diabetic ketoacidosis) ([place]) Active Problems:   Ehlers-Danlos syndrome    1. Diabetic ketoacidosis and type 1 diabetes -likely precipitated by possible dislodgment of patient's insulin pump.  Patient is on insulin infusion fluids follow metabolic panel closely and once anion gap gets corrected restart patient's home dose of insulin pump. 2. Intractable nausea vomiting likely from diabetic ketoacidosis continue to monitor closely.  Abdomen appears benign. 3. Acute renal failure -creatinine is around 1.7 and in March 2019 was around one.  Likely from intractable nausea vomiting.  I think will improve with fluids. 4. Leukocytosis likely reactionary.  Covid test is pending.  DVT prophylaxis: Lovenox. Code Status: Full code. Family Communication: Patient's father at the bedside. Disposition Plan: Home. Consults called: None. Admission status: Observation.   Rise Patience MD [facility] Pager [phone].  If 7PM-7AM, please contact night-coverage [email or web address] Password [facility]  09/19/2020, 4:35 AM

## 2020-09-20 DIAGNOSIS — E1010 Type 1 diabetes mellitus with ketoacidosis without coma: Secondary | ICD-10-CM | POA: Diagnosis not present

## 2020-09-20 DIAGNOSIS — N179 Acute kidney failure, unspecified: Secondary | ICD-10-CM | POA: Diagnosis not present

## 2020-09-20 LAB — BASIC METABOLIC PANEL
Anion gap: 10 (ref 5–15)
BUN: 22 mg/dL — ABNORMAL HIGH (ref 6–20)
CO2: 22 mmol/L (ref 22–32)
Calcium: 8.1 mg/dL — ABNORMAL LOW (ref 8.9–10.3)
Chloride: 104 mmol/L (ref 98–111)
Creatinine, Ser: 1.02 mg/dL (ref 0.61–1.24)
GFR, Estimated: >60 mL/min (ref >60)
Glucose, Bld: 249 mg/dL — ABNORMAL HIGH (ref 70–99)
Potassium: 4.2 mmol/L (ref 3.5–5.1)
Sodium: 136 mmol/L (ref 135–145)

## 2020-09-20 LAB — GLUCOSE, CAPILLARY
Glucose-Capillary: 232 mg/dL — ABNORMAL HIGH (ref 70–99)
Glucose-Capillary: 243 mg/dL — ABNORMAL HIGH (ref 70–99)
Glucose-Capillary: 249 mg/dL — ABNORMAL HIGH (ref 70–99)
Glucose-Capillary: 296 mg/dL — ABNORMAL HIGH (ref 70–99)

## 2020-09-20 MED ORDER — INSULIN GLARGINE 100 UNIT/ML ~~LOC~~ SOLN
25.0000 [IU] | Freq: Two times a day (BID) | SUBCUTANEOUS | Status: DC
  Filled 2020-09-20 (×2): qty 0.25

## 2020-09-20 MED ORDER — INSULIN ASPART 100 UNIT/ML ~~LOC~~ SOLN
5.0000 [IU] | Freq: Three times a day (TID) | SUBCUTANEOUS | Status: DC
  Administered 2020-09-20 (×2): 5 [IU] via SUBCUTANEOUS

## 2020-09-20 MED ORDER — INSULIN GLARGINE 100 UNIT/ML ~~LOC~~ SOLN
25.0000 [IU] | Freq: Two times a day (BID) | SUBCUTANEOUS | Status: DC
  Administered 2020-09-20: 25 [IU] via SUBCUTANEOUS
  Filled 2020-09-20 (×2): qty 0.25

## 2020-09-20 NOTE — Discharge Summary (Signed)
Physician Discharge Summary  [name] IOE:[id number] DOB: 04-06-1986 DOA: 09/18/2020  PCP: Leonard Downing, MD  Admit date: 09/18/2020 Discharge date: 09/20/2020  Admitted From: Home\ Disposition:  Home  Recommendations for Outpatient Follow-up:  1. Follow up with PCP in 1-2 weeks 2. Please obtain BMP/CBC in one week   Home Health:no Equipment/Devices:None  Discharge Condition:Stable CODE STATUS:Full Diet recommendation: Heart Healthy   Brief/Interim Summary: 35 y.o. male past medical history of diabetes mellitus type 1, Ehlers-Danlos syndrome present to the ED with nausea and vomiting with a sugar running around 500.  In the ER with anion gap of 21, bicarb of 16, with positive ketones  Discharge Diagnoses:  Principal Problem:   DKA (diabetic ketoacidosis) ([place]) Active Problems:   Ehlers-Danlos syndrome   AKI (acute kidney injury) ([place]) DKA: Due to dysfunctional his equipment with insulin pump, he was started on IV insulin IV fluids his gap resolved he was hydrated anion gap closed with bicarb again greater than 20. He was changed to long-acting insulin per sliding scale his blood glucose remained relatively well controlled.  Acute kidney injury: Likely prerenal azotemia in the setting of DKA resolved with IV fluid hydration.  Intractable nausea and vomiting, Likely due to DKA now resolved.  Leukocytosis: Likely reactive.   Discharge Instructions  Discharge Instructions    Diet - low sodium heart healthy   Complete by: As directed    Increase activity slowly   Complete by: As directed      Allergies as of 09/20/2020      Reactions   Sulfa Antibiotics Rash   unknown Other reaction(s): Unknown      Medication List    TAKE these medications   insulin aspart 100 UNIT/ML injection Commonly known as: novoLOG Inject 8-14 Units into the skin 3 (three) times daily with meals. Based on mealtime doses ad SSI   insulin detemir 100 UNIT/ML  injection Commonly known as: LEVEMIR Inject 0.28 mLs (28 Units total) into the skin at bedtime.   ONE TOUCH ULTRA TEST test strip Generic drug: glucose blood 1 each by Other route 4 (four) times daily.   oxyCODONE-acetaminophen 5-325 MG tablet Commonly known as: Roxicet Take 1-2 tablets by mouth every 4 (four) hours as needed (Pain).       Allergies  Allergen Reactions  . Sulfa Antibiotics Rash    unknown Other reaction(s): Unknown    Consultations:  None   Procedures/Studies:  No results found.  Subjective: No complains  Discharge Exam: Vitals:   09/20/20 0038 09/20/20 0418  BP: 124/76 120/72  Pulse: 75   Resp: 18 15  Temp: 98.1 F (36.7 C) 98.1 F (36.7 C)  SpO2: 97% 93%   Vitals:   09/19/20 1713 09/19/20 1945 09/20/20 0038 09/20/20 0418  BP: 126/79 121/80 124/76 120/72  Pulse:  88 75   Resp: 14 11 18 15   Temp:  97.7 F (36.5 C) 98.1 F (36.7 C) 98.1 F (36.7 C)  TempSrc:  Oral Oral Oral  SpO2: 96% 96% 97% 93%  Weight:        General: Pt is alert, awake, not in acute distress Cardiovascular: RRR, S1/S2 +, no rubs, no gallops Respiratory: CTA bilaterally, no wheezing, no rhonchi Abdominal: Soft, NT, ND, bowel sounds + Extremities: no edema, no cyanosis    The results of significant diagnostics from this hospitalization (including imaging, microbiology, ancillary and laboratory) are listed below for reference.     Microbiology: Recent Results (from the past 240 hour(s))  Resp  Panel by RT-PCR (Flu A&B, Covid) Nasopharyngeal Swab     Status: None   Collection Time: 09/19/20  6:26 AM   Specimen: Nasopharyngeal Swab; Nasopharyngeal(NP) swabs in vial transport medium  Result Value Ref Range Status   SARS Coronavirus 2 by RT PCR NEGATIVE NEGATIVE Final    Comment: (NOTE) SARS-CoV-2 target nucleic acids are NOT DETECTED.  The SARS-CoV-2 RNA is generally detectable in upper respiratory specimens during the acute phase of infection. The  lowest concentration of SARS-CoV-2 viral copies this assay can detect is 138 copies/mL. A negative result does not preclude SARS-Cov-2 infection and should not be used as the sole basis for treatment or other patient management decisions. A negative result may occur with  improper specimen collection/handling, submission of specimen other than nasopharyngeal swab, presence of viral mutation(s) within the areas targeted by this assay, and inadequate number of viral copies(<138 copies/mL). A negative result must be combined with clinical observations, patient history, and epidemiological information. The expected result is Negative.  Fact Sheet for Patients:  EntrepreneurPulse.com.au  Fact Sheet for Healthcare Providers:  IncredibleEmployment.be  This test is no t yet approved or cleared by the [place] FDA and  has been authorized for detection and/or diagnosis of SARS-CoV-2 by FDA under an Emergency Use Authorization (EUA). This EUA will remain  in effect (meaning this test can be used) for the duration of the COVID-19 declaration under Section 564(b)(1) of the Act, 21 U.S.C.section 360bbb-3(b)(1), unless the authorization is terminated  or revoked sooner.       Influenza A by PCR NEGATIVE NEGATIVE Final   Influenza B by PCR NEGATIVE NEGATIVE Final    Comment: (NOTE) The Xpert Xpress SARS-CoV-2/FLU/RSV plus assay is intended as an aid in the diagnosis of influenza from Nasopharyngeal swab specimens and should not be used as a sole basis for treatment. Nasal washings and aspirates are unacceptable for Xpert Xpress SARS-CoV-2/FLU/RSV testing.  Fact Sheet for Patients: EntrepreneurPulse.com.au  Fact Sheet for Healthcare Providers: IncredibleEmployment.be  This test is not yet approved or cleared by the [place] FDA and has been authorized for detection and/or diagnosis of SARS-CoV-2 by FDA under  an Emergency Use Authorization (EUA). This EUA will remain in effect (meaning this test can be used) for the duration of the COVID-19 declaration under Section 564(b)(1) of the Act, 21 U.S.C. section 360bbb-3(b)(1), unless the authorization is terminated or revoked.  Performed at [facility], [place] [address] [place], [place] [address]      Labs: BNP (last 3 results) No results for input(s): BNP in the last 8760 hours. Basic Metabolic Panel: Recent Labs  Lab 09/18/20 2019 09/19/20 1110 09/19/20 1753 09/20/20 0151  NA 139 138 138 136  K 4.1 3.8 4.0 4.2  CL 102 106 106 104  CO2 16* 18* 23 22  GLUCOSE 215* 191* 223* 249*  BUN 38* 31* 27* 22*  CREATININE 1.77* 1.24 1.17 1.02  CALCIUM 10.2 8.6* 8.5* 8.1*   Liver Function Tests: No results for input(s): AST, ALT, ALKPHOS, BILITOT, PROT, ALBUMIN in the last 168 hours. No results for input(s): LIPASE, AMYLASE in the last 168 hours. No results for input(s): AMMONIA in the last 168 hours. CBC: Recent Labs  Lab 09/18/20 2019 09/19/20 0450  WBC 13.7* 10.4  HGB 17.5* 14.9  HCT 50.1 45.6  MCV 94.0 98.7  PLT 286 205   Cardiac Enzymes: No results for input(s): CKTOTAL, CKMB, CKMBINDEX, TROPONINI in the last 168 hours. BNP: Invalid input(s): POCBNP CBG: Recent  Labs  Lab 09/19/20 1941 09/19/20 2049 09/20/20 0035 09/20/20 0420 09/20/20 0630  GLUCAP 261* 216* 232* 243* 249*   D-Dimer No results for input(s): DDIMER in the last 72 hours. Hgb A1c Recent Labs    09/19/20 0450  HGBA1C 7.9*   Lipid Profile No results for input(s): CHOL, HDL, LDLCALC, TRIG, CHOLHDL, LDLDIRECT in the last 72 hours. Thyroid function studies No results for input(s): TSH, T4TOTAL, T3FREE, THYROIDAB in the last 72 hours.  Invalid input(s): FREET3 Anemia work up No results for input(s): VITAMINB12, FOLATE, FERRITIN, TIBC, IRON, RETICCTPCT in the last 72 hours. Urinalysis    Component Value Date/Time   COLORURINE YELLOW 09/18/2020  1949   APPEARANCEUR CLEAR 09/18/2020 1949   LABSPEC 1.026 09/18/2020 1949   PHURINE 5.0 09/18/2020 1949   GLUCOSEU >=500 (A) 09/18/2020 1949   HGBUR NEGATIVE 09/18/2020 1949   BILIRUBINUR NEGATIVE 09/18/2020 1949   KETONESUR 80 (A) 09/18/2020 1949   PROTEINUR NEGATIVE 09/18/2020 1949   UROBILINOGEN 1.0 11/26/2007 0217   NITRITE NEGATIVE 09/18/2020 1949   LEUKOCYTESUR NEGATIVE 09/18/2020 1949   Sepsis Labs Invalid input(s): PROCALCITONIN,  WBC,  LACTICIDVEN Microbiology Recent Results (from the past 240 hour(s))  Resp Panel by RT-PCR (Flu A&B, Covid) Nasopharyngeal Swab     Status: None   Collection Time: 09/19/20  6:26 AM   Specimen: Nasopharyngeal Swab; Nasopharyngeal(NP) swabs in vial transport medium  Result Value Ref Range Status   SARS Coronavirus 2 by RT PCR NEGATIVE NEGATIVE Final    Comment: (NOTE) SARS-CoV-2 target nucleic acids are NOT DETECTED.  The SARS-CoV-2 RNA is generally detectable in upper respiratory specimens during the acute phase of infection. The lowest concentration of SARS-CoV-2 viral copies this assay can detect is 138 copies/mL. A negative result does not preclude SARS-Cov-2 infection and should not be used as the sole basis for treatment or other patient management decisions. A negative result may occur with  improper specimen collection/handling, submission of specimen other than nasopharyngeal swab, presence of viral mutation(s) within the areas targeted by this assay, and inadequate number of viral copies(<138 copies/mL). A negative result must be combined with clinical observations, patient history, and epidemiological information. The expected result is Negative.  Fact Sheet for Patients:  EntrepreneurPulse.com.au  Fact Sheet for Healthcare Providers:  IncredibleEmployment.be  This test is no t yet approved or cleared by the [place] FDA and  has been authorized for detection and/or diagnosis of  SARS-CoV-2 by FDA under an Emergency Use Authorization (EUA). This EUA will remain  in effect (meaning this test can be used) for the duration of the COVID-19 declaration under Section 564(b)(1) of the Act, 21 U.S.C.section 360bbb-3(b)(1), unless the authorization is terminated  or revoked sooner.       Influenza A by PCR NEGATIVE NEGATIVE Final   Influenza B by PCR NEGATIVE NEGATIVE Final    Comment: (NOTE) The Xpert Xpress SARS-CoV-2/FLU/RSV plus assay is intended as an aid in the diagnosis of influenza from Nasopharyngeal swab specimens and should not be used as a sole basis for treatment. Nasal washings and aspirates are unacceptable for Xpert Xpress SARS-CoV-2/FLU/RSV testing.  Fact Sheet for Patients: EntrepreneurPulse.com.au  Fact Sheet for Healthcare Providers: IncredibleEmployment.be  This test is not yet approved or cleared by the [place] FDA and has been authorized for detection and/or diagnosis of SARS-CoV-2 by FDA under an Emergency Use Authorization (EUA). This EUA will remain in effect (meaning this test can be used) for the duration of the COVID-19 declaration under Section 564(b)(1)  of the Act, 21 U.S.C. section 360bbb-3(b)(1), unless the authorization is terminated or revoked.  Performed at [facility], [place] [address] [place], [place] [address]      Time coordinating discharge: Over 30 minutes  SIGNED:   Charlynne Cousins, MD  [facility] 09/20/2020, 7:55 AM Pager   If 7PM-7AM, please contact night-coverage [email or web address] Password [email or web address]

## 2020-09-20 NOTE — Progress Notes (Signed)
Patient given discharge instructions, medication list all questions answered. Patients transportation will be here around 1230. Will discharge once transportation arrives. Will be transported to exit via wheel chair and hospital staff. Mayley Lish, Bettina Gavia Rn

## 2021-01-06 DEATH — deceased
